# Patient Record
Sex: Female | Born: 1965 | Race: Black or African American | Hispanic: No | Marital: Single | Smoking: Former smoker
Health system: Southern US, Community
[De-identification: ages and names within clinical notes are randomized; demographics above are authoritative.]

## PROBLEM LIST (undated history)

## (undated) ENCOUNTER — Emergency Department (HOSPITAL_BASED_OUTPATIENT_CLINIC_OR_DEPARTMENT_OTHER): Payer: Self-pay

## (undated) DIAGNOSIS — R7303 Prediabetes: Secondary | ICD-10-CM

## (undated) DIAGNOSIS — M1990 Unspecified osteoarthritis, unspecified site: Secondary | ICD-10-CM

## (undated) DIAGNOSIS — E559 Vitamin D deficiency, unspecified: Secondary | ICD-10-CM

## (undated) HISTORY — DX: Prediabetes: R73.03

## (undated) HISTORY — DX: Unspecified osteoarthritis, unspecified site: M19.90

## (undated) HISTORY — DX: Vitamin D deficiency, unspecified: E55.9

## (undated) HISTORY — PX: OTHER SURGICAL HISTORY: SHX169

---

## 1990-11-01 HISTORY — PX: TUBAL LIGATION: SHX77

## 2011-11-15 ENCOUNTER — Emergency Department (INDEPENDENT_AMBULATORY_CARE_PROVIDER_SITE_OTHER)
Admission: EM | Admit: 2011-11-15 | Discharge: 2011-11-15 | Disposition: A | Discharge status: Home / Self Care | Payer: Self-pay | Source: Home / Self Care | Attending: Emergency Medicine | Admitting: Emergency Medicine

## 2011-11-15 ENCOUNTER — Encounter (HOSPITAL_COMMUNITY): Payer: Self-pay | Admitting: Emergency Medicine

## 2011-11-15 DIAGNOSIS — K130 Diseases of lips: Secondary | ICD-10-CM

## 2011-11-15 MED ORDER — LIDOCAINE VISCOUS 2 % MT SOLN: 10.0000 mL | OROMUCOSAL | Status: AC | PRN

## 2011-11-15 NOTE — ED Provider Notes (Signed)
History     CSN: [id number]  Arrival date & time 11/15/11  1140   First MD Initiated Contact with Patient 11/15/11 1354      Chief Complaint  Patient presents with  . Mouth Lesions    (Consider location/radiation/quality/duration/timing/severity/associated sxs/prior treatment) HPI Comments: Pt states she bit her inner lip about 5 months ago and reports tender gradually enlarging mass in this area. States she has a habit of biting it as it gets larger.has been doing salt water and peroxide gargles w/o relief. Lesion more painful when eating hot foods/liquids. No purulent d/c, other oral lesions. Pt is smoker. No ST, tongue pain, neck pain, unintentional weight loss.   Patient is a 46 y.o. female presenting with mouth sores. The history is provided by the patient.  Mouth Lesions  The current episode started more than 2 weeks ago. The onset was gradual. The problem has been gradually worsening. Associated symptoms include mouth sores. Pertinent negatives include no fever, no nausea, no vomiting, no sore throat, no swollen glands and no rash.    History reviewed. No pertinent past medical history.  History reviewed. No pertinent past surgical history.  History reviewed. No pertinent family history.  History  Substance Use Topics  . Smoking status: Current Everyday Smoker  . Smokeless tobacco: Not on file  . Alcohol Use: No    OB History    Grav Para Term Preterm Abortions TAB SAB Ect Mult Living                  Review of Systems  Constitutional: Negative for fever and unexpected weight change.  HENT: Positive for mouth sores. Negative for sore throat and voice change.   Gastrointestinal: Negative for nausea and vomiting.  Skin: Negative for rash.    Allergies  Review of patient's allergies indicates no known allergies.  Home Medications   Current Outpatient Rx  Name Route Sig Dispense Refill  . LIDOCAINE VISCOUS 2 % MT SOLN Oral Take 10 mLs by mouth as needed for  pain. Swish and spit. Do not swallow. 60 mL 0    BP 136/87  Pulse 71  Temp(Src) 97.5 F (36.4 C) (Oral)  Resp 18  SpO2 96%  LMP 11/11/2011  Physical Exam  Nursing note and vitals reviewed. Constitutional: She is oriented to person, place, and time. She appears well-developed and well-nourished. No distress.  HENT:  Head: Normocephalic and atraumatic.  Nose: Nose normal.  Mouth/Throat: Uvula is midline and oropharynx is clear and moist. Oral lesions present.       approx 0.5 cm nodule appears to be hypertrophic area of inner lip mucosa. Small central ulceration. No expressable purulent d/c. Tongue , gums, other oral mucosa WNL, no swelling under tongue.   Eyes: Conjunctivae and EOM are normal.  Neck: Normal range of motion. Neck supple.  Cardiovascular: Regular rhythm.   Pulmonary/Chest: Effort normal.  Abdominal: She exhibits no distension.  Musculoskeletal: Normal range of motion.  Lymphadenopathy:       Head (right side): No submental and no submandibular adenopathy present.       Head (left side): No submental and no submandibular adenopathy present.    She has no cervical adenopathy.  Neurological: She is alert and oriented to person, place, and time.  Skin: Skin is warm and dry.  Psychiatric: She has a normal mood and affect. Her behavior is normal. Judgment and thought content normal.    ED Course  Procedures (including critical care time)  Labs Reviewed -  No data to display No results found.   1. Lip lesion     MDM  Appears to be hypertrophic mucosal tissue from continuous irritation. No signs of infection or oral cancer. Will refer to Dr. Lazarus Salines ENT on call for definitive dx and tx. Will send home with viscous lidocaine if becomes very painful but advised pt that she may injure this area worse while area is numb.    Luiz Blare, MD 11/15/11 [phone]

## 2011-11-15 NOTE — ED Notes (Signed)
PT HEREWITH MOUTH LESION TO RIGHT INNER LIP THAT APPEARED X AGO BUT HAS GOTTEN BIGGER AND HURTS WITH EATING HOT FOODS.PT TRIED GARGLING WITH SALT BUT NO IMPROVEMENT.PT ALSO STATES SHE CAN'T HAVE ANY NARCOTICS S/P DRUG RECOVERY X 

## 2011-11-19 NOTE — ED Notes (Signed)
Medical follow-up request form received from Dr. Chaney Malling for Dr. Lazarus Salines  in 1-2 weeks for polyp on inner mucosa of right lip.  Appt. scheduled with Dr. Jenne Pane on Wed. 1/23 @ 0950.  She said since pt. is self pay, she will have to pay $100.00 up front. I called pt. and gave her appt. date time, location, phone number and up front payment. Pt. requested appt. on Mon. I told her it was to late for me to call.   I told her she would have to call and reschedule if that is not convenient.  Record faxed attn Thayer Ohm to [phone] and confirmation received. Vassie Moselle 11/19/2011

## 2012-06-15 ENCOUNTER — Other Ambulatory Visit (HOSPITAL_COMMUNITY): Payer: Self-pay | Admitting: Family Medicine

## 2012-06-15 ENCOUNTER — Other Ambulatory Visit (HOSPITAL_COMMUNITY)
Admission: RE | Admit: 2012-06-15 | Discharge: 2012-06-15 | Disposition: A | Discharge status: Home / Self Care | Payer: Self-pay | Source: Ambulatory Visit | Attending: Family Medicine | Admitting: Family Medicine

## 2012-06-15 DIAGNOSIS — Z113 Encounter for screening for infections with a predominantly sexual mode of transmission: Secondary | ICD-10-CM | POA: Insufficient documentation

## 2012-06-15 DIAGNOSIS — Z1231 Encounter for screening mammogram for malignant neoplasm of breast: Secondary | ICD-10-CM

## 2012-06-15 DIAGNOSIS — Z124 Encounter for screening for malignant neoplasm of cervix: Secondary | ICD-10-CM | POA: Insufficient documentation

## 2012-06-29 ENCOUNTER — Ambulatory Visit (HOSPITAL_COMMUNITY)
Admission: RE | Admit: 2012-06-29 | Discharge: 2012-06-29 | Disposition: A | Discharge status: Home / Self Care | Payer: Self-pay | Source: Ambulatory Visit | Attending: Family Medicine | Admitting: Family Medicine

## 2012-06-29 DIAGNOSIS — Z1231 Encounter for screening mammogram for malignant neoplasm of breast: Secondary | ICD-10-CM | POA: Insufficient documentation

## 2013-12-17 ENCOUNTER — Encounter (INDEPENDENT_AMBULATORY_CARE_PROVIDER_SITE_OTHER): Payer: Self-pay | Admitting: Surgery

## 2014-01-13 ENCOUNTER — Emergency Department (HOSPITAL_COMMUNITY): Payer: Self-pay

## 2014-01-13 ENCOUNTER — Emergency Department (HOSPITAL_COMMUNITY)
Admission: EM | Admit: 2014-01-13 | Discharge: 2014-01-13 | Disposition: A | Discharge status: Home / Self Care | Payer: Self-pay | Attending: Emergency Medicine | Admitting: Emergency Medicine

## 2014-01-13 ENCOUNTER — Encounter (HOSPITAL_COMMUNITY): Payer: Self-pay | Admitting: Emergency Medicine

## 2014-01-13 DIAGNOSIS — R197 Diarrhea, unspecified: Secondary | ICD-10-CM | POA: Insufficient documentation

## 2014-01-13 DIAGNOSIS — J111 Influenza due to unidentified influenza virus with other respiratory manifestations: Secondary | ICD-10-CM | POA: Insufficient documentation

## 2014-01-13 DIAGNOSIS — R69 Illness, unspecified: Secondary | ICD-10-CM

## 2014-01-13 DIAGNOSIS — F17200 Nicotine dependence, unspecified, uncomplicated: Secondary | ICD-10-CM | POA: Insufficient documentation

## 2014-01-13 MED ORDER — ACETAMINOPHEN 500 MG PO TABS
1000.0000 mg | ORAL_TABLET | Freq: Once | ORAL | Status: AC
  Administered 2014-01-13: 1000 mg via ORAL
  Filled 2014-01-13: qty 2

## 2014-01-13 MED ORDER — ALBUTEROL SULFATE HFA 108 (90 BASE) MCG/ACT IN AERS
2.0000 | INHALATION_SPRAY | RESPIRATORY_TRACT | Status: DC | PRN
  Administered 2014-01-13: 2 via RESPIRATORY_TRACT
  Filled 2014-01-13: qty 6.7

## 2014-01-13 MED ORDER — AEROCHAMBER PLUS W/MASK MISC
1.0000 | Freq: Once | Status: AC
  Administered 2014-01-13: 1
  Filled 2014-01-13: qty 1

## 2014-01-13 NOTE — ED Provider Notes (Signed)
CSN: [id number]     Arrival date & time 01/13/14  1202 History   First MD Initiated Contact with Patient 01/13/14 1331     Chief Complaint  Patient presents with  . Influenza     (Consider location/radiation/quality/duration/timing/severity/associated sxs/prior Treatment) HPI complains of diffuse myalgias headache and productive cough and shortness of breath onset 4 days ago. She admits to subjective fever did not take her temperature. Also admits to 4 episodes of diarrhea in the past 4 days. Nonbloody diarrhea. Has been treating herself with Alka-Seltzer and Halls cough drops, without relief. Associated symptoms include shortness of breath. Nothing makes symptoms better or worse. She admits to chest pain with coughing only. History reviewed. No pertinent past medical history. past medical history recovered cocaine addict. No history of IV drug use. History reviewed. No pertinent past surgical history. History reviewed. No pertinent family history. History  Substance Use Topics  . Smoking status: Current Every Day Smoker  . Smokeless tobacco: Not on file  . Alcohol Use: No   ex smoker quit 10 months ago no alcohol OB History   Grav Para Term Preterm Abortions TAB SAB Ect Mult Living                 Review of Systems  Respiratory: Positive for cough and shortness of breath.   Gastrointestinal: Positive for diarrhea.  Musculoskeletal: Positive for myalgias.  All other systems reviewed and are negative.      Allergies  Review of patient's allergies indicates no known allergies.  Home Medications   Current Outpatient Rx  Name  Route  Sig  Dispense  Refill  . Menthol (HALLS COUGH DROPS MT)   Mouth/Throat   Use as directed 1 lozenge in the mouth or throat every 4 (four) hours as needed (cough).         . Sodium & Potassium Bicarbonate (ALKA-SELTZER GOLD PO)   Oral   Take 2 tablets by mouth daily as needed (sleep).          BP 136/82  Pulse 62  Temp(Src) 98.7 F  (37.1 C) (Oral)  Resp 18  SpO2 99% Physical Exam  Nursing note and vitals reviewed. Constitutional: She appears well-developed and well-nourished.  HENT:  Head: Normocephalic and atraumatic.  Eyes: Conjunctivae are normal. Pupils are equal, round, and reactive to light.  Neck: Neck supple. No tracheal deviation present. No thyromegaly present.  Cardiovascular: Normal rate and regular rhythm.   No murmur heard. Pulmonary/Chest: Effort normal and breath sounds normal.  Abdominal: Soft. Bowel sounds are normal. She exhibits no distension. There is no tenderness.  Musculoskeletal: Normal range of motion. She exhibits no edema and no tenderness.  Neurological: She is alert. Coordination normal.  Skin: Skin is warm and dry. No rash noted.  Psychiatric: She has a normal mood and affect.    ED Course  Procedures (including critical care time) Labs Review Labs Reviewed - No data to display Imaging Review No results found.   EKG Interpretation None     Patient feels much improved after treatment with albuterol inhaler  And Tylenol. Feels ready to go home.  Chest x-ray viewed by me. No results found for this or any previous visit. Dg Chest 2 View  01/13/2014   CLINICAL DATA:  Chest pain and productive cough  EXAM: CHEST  2 VIEW  COMPARISON:  None.  FINDINGS: Lungs are  planPlaclear. The heart size and pulmonary vascularity are normal. No adenopathy. No pneumothorax. No bone lesions. There is an  old healed fracture of the left clavicle.  IMPRESSION: No edema or consolidation.   Electronically Signed   By: Lowella Grip M.D.   On: 01/13/2014 15:27    MDM   Final diagnoses:  None   Plan albuterol inhaler with spacer to go to use 2 puffs every 4 hours when necessary cough or shortness of breath. Tylenol for pain. Referral resource guide were: [facility]ness Center to get a primary care physician Diagnosis influenza-like illness     Orlie Dakin, MD 01/13/14 1539

## 2014-01-13 NOTE — Discharge Instructions (Signed)
Use your inhaler with spacer 2 puffs every 4 hours as needed for cough shortness of breath. Return if needed more than every 4 hours. Take Tylenol as directed for pain every 4 hours as needed. Call the wellness Center or any of the numbers on the resource guide to get a primary care physician.  Emergency Department Resource Guide 1) Find a Doctor and Pay Out of Pocket Although you won't have to find out who is covered by your insurance plan, it is a good idea to ask around and get recommendations. You will then need to call the office and see if the doctor you have chosen will accept you as a new patient and what types of options they offer for patients who are self-pay. Some doctors offer discounts or will set up payment plans for their patients who do not have insurance, but you will need to ask so you aren't surprised when you get to your appointment.  2) Contact Your Local Health Department Not all health departments have doctors that can see patients for sick visits, but many do, so it is worth a call to see if yours does. If you don't know where your local health department is, you can check in your phone book. The CDC also has a tool to help you locate your state's health department, and many state websites also have listings of all of their local health departments.  3) Find a [facility] If your illness is not likely to be very severe or complicated, you may want to try a walk in clinic. These are popping up all over the country in pharmacies, drugstores, and shopping centers. They're usually staffed by nurse practitioners or physician assistants that have been trained to treat common illnesses and complaints. They're usually fairly quick and inexpensive. However, if you have serious medical issues or chronic medical problems, these are probably not your best option.  No Primary Care Doctor: - Call Health Connect at  [phone] - they can help you locate a primary care doctor that  accepts  your insurance, provides certain services, etc. - Physician Referral Service- [phone]  Chronic Pain Problems: Organization         Address  Phone   Notes  [facility]  [phone] Patients need to be referred by their primary care doctor.   Medication Assistance: Organization         Address  Phone   Notes  Comprehensive Outpatient [place] Medication [facility] [address] [place], [place] [address] [phone] --Must be a resident of [facility] -- Must have NO insurance coverage whatsoever (no Medicaid/ Medicare, etc.) -- The pt. MUST have a primary care doctor that directs their care regularly and follows them in the community   MedAssist  [phone]   [employer]  [phone]    Agencies that provide inexpensive medical care: Organization         Address  Phone   Notes  [place]  [phone]   [facility]    [phone]   [facility] [place], [place] [address] [phone]   [place] [address], [place] [phone]   Planned Parenthood    [phone]   [facility]    [phone]   [place] and [place], [place] Phone:  [phone], Fax:  [phone] Hours  of Operation:  9 am - 6 pm, M-F.  Also accepts Medicaid/Medicare and self-pay.  [facility] for [place], [address], [place] Phone: [phone], Fax: [phone]. Hours of Operation:  8:30 am - 5:30 pm, M-F.  Also accepts Medicaid and self-pay.  [facility] [place] [address], [place] Phone: [phone]   [place], [place], [place] [phone], Ext. [phone] Mondays & Thursdays: 7-9 AM.  First 15 patients are seen on a first come, first serve basis.    [place] Providers:  Organization          Address  Phone   Notes  [facility] [address],  [phone] Also accepts self-pay patients.  [facility] [address], [place]  [phone]   [place], [address], [place] [phone]   [facility] Family Medicine [address] [place] [phone]   Lucianne Lei [address], [place]   [phone] Only accepts [place] Access [place] patients after they have their name applied to their card.   Self-Pay (no insurance) in [facility]:  Organization         Address  Phone   Notes  Sickle Cell Patients, [facility] Internal Medicine [place] [phone]   [facility] [place] [phone]   [facility] [place]  [place], [place], [place] [phone]   [facility]/Dr. Osei-Bonsu  [address], [place] or [address], [place] [phone] Phone number for both [place] and [place] locations is the same.  [facility]gent [facility] and [facility] [address] [place] [phone]   [facility] [address], [place] or [address] [phone]   [facility] [address] [place] [phone], phone; [phone], fax Sees patients 1st and 3rd Saturday of every month.  Must not qualify for public or private insurance (i.e. Medicaid, Medicare, [place] Health Choice, Veterans' Benefits)  Household income should be no more than 200% of the poverty level The clinic cannot treat you if you are pregnant or think you are pregnant  Sexually transmitted diseases are not treated at the clinic.    Dental Care: Organization         Address  Phone  Notes  [facility] Department of [facility] [phone] Accepts children up to age 64 who are enrolled in [place] or [place]; pregnant women with a Medicaid card; and children who have applied for Medicaid or [place] Health Choice, but were declined, whose parents can pay a reduced fee at time of service.  [facility] Department of [facility]  [address], [place] [phone] Accepts children up to age 2 who are enrolled in [place] or [place]; pregnant women with a Medicaid card; and children who have applied for Medicaid or [place] Health Choice, but were declined, whose parents can pay a reduced fee at time of service.  [place] Adult Dental Access PROGRAM  [place] [phone] Patients are seen by appointment only. Walk-ins are not accepted. [place] will see  patients 61 years of age and older. Monday - Tuesday (8am-5pm) Most Wednesdays (8:30-5pm) $30 per visit, cash only  [facility] Adult Dental Access PROGRAM  [address], [facility] [phone] Patients are seen by appointment only. Walk-ins are not accepted. [place] will see patients 29 years of age and older. One Wednesday Evening (Monthly: Volunteer Based).  $30 per visit, cash only  [place]  [phone] for adults; Children under age 71, call Graduate Pediatric Dentistry at [phone]. Children aged 36-14, please call [phone] to request a pediatric application.  Dental services are provided in all areas of dental care including fillings, crowns and bridges, complete and partial dentures, implants, gum treatment, root canals, and extractions. Preventive care is also provided. Treatment is provided to both adults and children. Patients are selected via a lottery and there is often a waiting list.   [facility] [address], [place]  [phone] [email or web address]   [facility] [address] [place], [place]  [phone], Ext. [phone] Second and Fourth Thursday of each month, opens at 6:30 AM; Clinic ends at 9 AM.  Patients are seen on a first-come first-served basis, and a limited number are seen during each clinic.   [facility]  [address] Hillard Danker [place], [place] [phone]   Eligibility Requirements You must have lived in [place], [place], or [place] counties for at least the last three months.   You cannot be eligible for state or federal sponsored [employer], including [employer], [place], or [employer].   You generally cannot be eligible for healthcare insurance through your employer.    How to apply: Eligibility screenings are held every Tuesday and Wednesday afternoon from 1:00 pm until 4:00 pm. You do not need an appointment for the interview!  [facility] [address] [place], [place]   [place]  [place] Department  [place]  [phone]    Behavioral Health Resources in the Community: Intensive Outpatient Programs Organization         Address  Phone  Notes  Leona Louin. [address] [place], [place] [phone]   [facility] Outpatient [address], [place], [place]   ADS: Alcohol & Drug Svcs [address] [place], [place]   [place] [address]  [place], [place] or [phone]   Substance Abuse Resources Organization         Address  Phone  Notes  Alcohol and Drug Services  [phone]   [place]  [phone]   [facility] [place]   Chinita Pester  [phone]   Residential & Outpatient Substance Abuse Program  [phone]   Psychological Services Organization         Address  Phone  Notes  [facility]  [place]  [phone]    [facility] [address] [place] or [phone]    Mobile Crisis Teams Organization         Address  Phone  Notes  [facility], Mobile Crisis Care Unit  [phone]   Assertive Psychotherapeutic Services  [address] [place], [place]   [facility] [address] [place] [phone]    Self-Help/Support Groups Organization  Address  Phone             Notes  [place]. of [place] - variety of support groups  [place] Call for more information  Narcotics Anonymous (NA), [facility] [address], [employer] [place]  2 meetings at this location   Special educational needs teacher         Address  Phone  Notes  ASAP Residential Treatment [place],    [place]  [phone]   [facility]  [address], [place] [phone], [place], [place]   [place], [place] [phone] Admissions: 8am-3pm M-F  Incentives Substance [place] [address]    [place], [place] [phone]   [facility] [address] [place], [place], [place]   [facility] [address],  [place], [place]   [facility] Programs - Intensive Outpatient [address] Kristeen [address], [place], [place]   [facility] [place]  [place], [place] [phone] or [phone]   [facility] ([facility]) [address], [place], [place] Accepts Medicaid  [facility] [place] [address],  [place] [phone] Substance Abuse/Addiction Treatment   [facility] Organization         Address  Phone  Notes  [facility]  [phone]   Domenic Schwab, PhD [address] Arlis [place], [place]   [phone] or [phone]   [place]  [place], [place] [phone]   [facility] [address], [place], [place] [phone] Insurance/Medicaid/sponsorship through [facility] and Families [address], [address] [place]                                    [place], [place] [phone] [place] [address], [place] [phone]    Dr. Adele Schilder  [phone]   [facility] Dept. 1) [address] [place] 2) [place] 3)  [place] [address], [place] [phone]  [phone]   [place] [phone] or [phone] (After Hours)

## 2014-01-13 NOTE — ED Notes (Signed)
Patient transported to X-ray 

## 2014-01-13 NOTE — ED Notes (Signed)
Reports flu like symptoms for several days including diarrhea, fever/chills, bodyaches, productive cough and headache. Mask on pt at triage.

## 2014-01-13 NOTE — ED Notes (Signed)
Patient returned from X-ray 

## 2014-05-11 ENCOUNTER — Emergency Department (HOSPITAL_COMMUNITY): Payer: Self-pay

## 2014-05-11 ENCOUNTER — Emergency Department (HOSPITAL_COMMUNITY)
Admission: EM | Admit: 2014-05-11 | Discharge: 2014-05-11 | Disposition: A | Discharge status: Home / Self Care | Payer: Self-pay | Attending: Emergency Medicine | Admitting: Emergency Medicine

## 2014-05-11 ENCOUNTER — Encounter (HOSPITAL_COMMUNITY): Payer: Self-pay | Admitting: Emergency Medicine

## 2014-05-11 DIAGNOSIS — M549 Dorsalgia, unspecified: Secondary | ICD-10-CM | POA: Insufficient documentation

## 2014-05-11 DIAGNOSIS — M62838 Other muscle spasm: Secondary | ICD-10-CM | POA: Insufficient documentation

## 2014-05-11 DIAGNOSIS — F17200 Nicotine dependence, unspecified, uncomplicated: Secondary | ICD-10-CM | POA: Insufficient documentation

## 2014-05-11 DIAGNOSIS — R071 Chest pain on breathing: Secondary | ICD-10-CM | POA: Insufficient documentation

## 2014-05-11 LAB — CBC
HCT: 35.7 % — ABNORMAL LOW (ref 36.0–46.0)
Hemoglobin: 11.9 g/dL — ABNORMAL LOW (ref 12.0–15.0)
MCH: 30.1 pg (ref 26.0–34.0)
MCHC: 33.3 g/dL (ref 30.0–36.0)
MCV: 90.2 fL (ref 78.0–100.0)
PLATELETS: 310 10*3/uL (ref 150–400)
RBC: 3.96 MIL/uL (ref 3.87–5.11)
RDW: 13.4 % (ref 11.5–15.5)
WBC: 8.2 10*3/uL (ref 4.0–10.5)

## 2014-05-11 LAB — I-STAT TROPONIN, ED: Troponin i, poc: 0 ng/mL (ref 0.00–0.08)

## 2014-05-11 LAB — BASIC METABOLIC PANEL
ANION GAP: 15 (ref 5–15)
BUN: 13 mg/dL (ref 6–23)
CALCIUM: 9.1 mg/dL (ref 8.4–10.5)
CO2: 23 mEq/L (ref 19–32)
CREATININE: 0.6 mg/dL (ref 0.50–1.10)
Chloride: 103 mEq/L (ref 96–112)
GFR calc non Af Amer: >90 mL/min (ref >90)
Glucose, Bld: 144 mg/dL — ABNORMAL HIGH (ref 70–99)
Potassium: 3.6 mEq/L — ABNORMAL LOW (ref 3.7–5.3)
SODIUM: 141 meq/L (ref 137–147)

## 2014-05-11 MED ORDER — CYCLOBENZAPRINE HCL 10 MG PO TABS
5.0000 mg | ORAL_TABLET | Freq: Once | ORAL | Status: AC
  Administered 2014-05-11: 5 mg via ORAL
  Filled 2014-05-11: qty 1

## 2014-05-11 MED ORDER — CYCLOBENZAPRINE HCL 5 MG PO TABS: 5.0000 mg | ORAL_TABLET | Freq: Three times a day (TID) | ORAL | Status: DC | PRN

## 2014-05-11 MED ORDER — IBUPROFEN 800 MG PO TABS: 800.0000 mg | ORAL_TABLET | Freq: Three times a day (TID) | ORAL | Status: DC | PRN

## 2014-05-11 MED ORDER — IBUPROFEN 800 MG PO TABS
800.0000 mg | ORAL_TABLET | Freq: Once | ORAL | Status: AC
  Administered 2014-05-11: 800 mg via ORAL
  Filled 2014-05-11: qty 1

## 2014-05-11 NOTE — Discharge Instructions (Signed)

## 2014-05-11 NOTE — ED Provider Notes (Signed)
CSN: [id number]     Arrival date & time 05/11/14  1122 History   First MD Initiated Contact with Patient 05/11/14 1132     Chief Complaint  Patient presents with  . Chest Pain  . Extremity Weakness  . Back Pain     (Consider location/radiation/quality/duration/timing/severity/associated sxs/prior Treatment) HPI Comments: Also having similar soreness/aching in upper back  Patient is a 48 y.o. female presenting with chest pain, extremity weakness, and back pain. The history is provided by the patient.  Chest Pain Pain location:  R chest Pain quality: aching   Pain radiates to:  Does not radiate Pain radiates to the back: no   Pain severity:  Moderate Onset quality:  Gradual Timing:  Intermittent Progression:  Unchanged Chronicity:  New Context: at rest   Relieved by:  Nothing Worsened by:  Nothing tried Ineffective treatments:  None tried Associated symptoms: back pain and numbness (tingling in R arm)   Associated symptoms: no abdominal pain, no cough, no shortness of breath and not vomiting   Extremity Weakness Associated symptoms include chest pain. Pertinent negatives include no abdominal pain and no shortness of breath.  Back Pain Associated symptoms: chest pain and numbness (tingling in R arm)   Associated symptoms: no abdominal pain     History reviewed. No pertinent past medical history. History reviewed. No pertinent past surgical history. History reviewed. No pertinent family history. History  Substance Use Topics  . Smoking status: Current Every Day Smoker  . Smokeless tobacco: Not on file  . Alcohol Use: No   OB History   Grav Para Term Preterm Abortions TAB SAB Ect Mult Living                 Review of Systems  Respiratory: Negative for cough and shortness of breath.   Cardiovascular: Positive for chest pain.  Gastrointestinal: Negative for vomiting and abdominal pain.  Musculoskeletal: Positive for back pain and extremity weakness.  Neurological:  Positive for numbness (tingling in R arm).  All other systems reviewed and are negative.     Allergies  Review of patient's allergies indicates no known allergies.  Home Medications   Prior to Admission medications   Medication Sig Start Date End Date Taking? Authorizing Provider  ibuprofen (ADVIL,MOTRIN) 200 MG tablet Take 200 mg by mouth every 6 (six) hours as needed for mild pain.   Yes Historical Provider, MD   BP 107/71  Pulse 72  Temp(Src) 98.3 F (36.8 C) (Oral)  Resp 18  Ht 5\' 2"  (1.575 m)  Wt 235 lb (106.595 kg)  BMI 42.97 kg/m2  SpO2 99%  LMP 05/10/2014 Physical Exam  Nursing note and vitals reviewed. Constitutional: She is oriented to person, place, and time. She appears well-developed and well-nourished. No distress.  HENT:  Head: Normocephalic and atraumatic.  Eyes: EOM are normal. Pupils are equal, round, and reactive to light.  Neck: Normal range of motion. Neck supple.  Cardiovascular: Normal rate and regular rhythm.  Exam reveals no friction rub.   No murmur heard. Pulmonary/Chest: Effort normal and breath sounds normal. No respiratory distress. She has no wheezes. She has no rales. She exhibits tenderness (R chest wall).  Abdominal: Soft. She exhibits no distension. There is no tenderness. There is no rebound.  Musculoskeletal: Normal range of motion. She exhibits no edema.       Cervical back: She exhibits tenderness (R trapezius).  Neurological: She is alert and oriented to person, place, and time. No cranial nerve deficit. She exhibits  normal muscle tone. Coordination normal.  Skin: She is not diaphoretic.    ED Course  Procedures (including critical care time) Labs Review Labs Reviewed  [place], ED    Imaging Review No results found.   EKG Interpretation   Date/Time:  Saturday May 11 2014 11:27:47 EDT Ventricular Rate:  71 PR Interval:  184 QRS Duration: 88 QT Interval:  392 QTC Calculation:  425 R Axis:   13 Text Interpretation:  Normal sinus rhythm Normal ECG No prior Confirmed by  Mingo Amber  MD, Reni Hausner ([id number]) on 05/11/2014 11:33:12 AM      MDM   Final diagnoses:  Muscle spasm    70F presents with chest pain. Began this morning. Described as achiness in her right chest wall and right back. No radiation through to the back. Mild associated tingling described as pins and needles in her right arm. 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 Denies any pleuritic pain, cough, other left-sided chest pain, fever. On exam she has right trapezius and right chest wall tenderness. Right arm is neurovascularly intact, but has mild altered light touch. I think she has muscle spasm causing her tingling. Given Motrin and Flexeril. EKG normal. Troponin normal - done in triage. Exam and history not consistent with ACS. Feeling better after meds. Stable for discharge.     Osvaldo Shipper, MD 05/11/14 1339

## 2014-05-11 NOTE — ED Notes (Signed)
Pt reports back and chest paina nd right arm numbness, onset this am, sts she does lift things at work, sts she does not want narcotics.

## 2014-07-13 ENCOUNTER — Emergency Department (HOSPITAL_COMMUNITY)
Admission: EM | Admit: 2014-07-13 | Discharge: 2014-07-13 | Discharge status: Against Medical Advice | Payer: Self-pay | Attending: Emergency Medicine | Admitting: Emergency Medicine

## 2014-07-13 DIAGNOSIS — F17200 Nicotine dependence, unspecified, uncomplicated: Secondary | ICD-10-CM | POA: Insufficient documentation

## 2014-07-13 DIAGNOSIS — M79609 Pain in unspecified limb: Secondary | ICD-10-CM | POA: Insufficient documentation

## 2014-07-13 NOTE — ED Notes (Signed)
Pt is unable to be located

## 2014-07-13 NOTE — ED Notes (Signed)
Called pt with no answer.

## 2014-07-13 NOTE — ED Notes (Signed)
The pt walked out of triage room and told ed staff "she was not waiting." unable to locate pt with pager and name call

## 2014-11-01 DIAGNOSIS — E559 Vitamin D deficiency, unspecified: Secondary | ICD-10-CM

## 2014-11-01 HISTORY — DX: Vitamin D deficiency, unspecified: E55.9

## 2014-12-18 ENCOUNTER — Encounter: Payer: Self-pay | Admitting: Internal Medicine

## 2014-12-18 ENCOUNTER — Ambulatory Visit: Payer: Self-pay | Attending: Internal Medicine | Admitting: Internal Medicine

## 2014-12-18 VITALS — BP 116/80 | HR 74 | Temp 98.8°F | Resp 16 | Wt 243.8 lb

## 2014-12-18 DIAGNOSIS — Z139 Encounter for screening, unspecified: Secondary | ICD-10-CM

## 2014-12-18 DIAGNOSIS — R7303 Prediabetes: Secondary | ICD-10-CM | POA: Insufficient documentation

## 2014-12-18 DIAGNOSIS — H6123 Impacted cerumen, bilateral: Secondary | ICD-10-CM

## 2014-12-18 DIAGNOSIS — E119 Type 2 diabetes mellitus without complications: Secondary | ICD-10-CM | POA: Insufficient documentation

## 2014-12-18 DIAGNOSIS — N951 Menopausal and female climacteric states: Secondary | ICD-10-CM

## 2014-12-18 DIAGNOSIS — R232 Flushing: Secondary | ICD-10-CM

## 2014-12-18 DIAGNOSIS — Z833 Family history of diabetes mellitus: Secondary | ICD-10-CM | POA: Insufficient documentation

## 2014-12-18 DIAGNOSIS — R7309 Other abnormal glucose: Secondary | ICD-10-CM | POA: Insufficient documentation

## 2014-12-18 DIAGNOSIS — Z1231 Encounter for screening mammogram for malignant neoplasm of breast: Secondary | ICD-10-CM | POA: Insufficient documentation

## 2014-12-18 DIAGNOSIS — Z0000 Encounter for general adult medical examination without abnormal findings: Secondary | ICD-10-CM

## 2014-12-18 DIAGNOSIS — H6121 Impacted cerumen, right ear: Secondary | ICD-10-CM | POA: Insufficient documentation

## 2014-12-18 DIAGNOSIS — Z3202 Encounter for pregnancy test, result negative: Secondary | ICD-10-CM | POA: Insufficient documentation

## 2014-12-18 DIAGNOSIS — E28310 Symptomatic premature menopause: Secondary | ICD-10-CM | POA: Insufficient documentation

## 2014-12-18 DIAGNOSIS — H6120 Impacted cerumen, unspecified ear: Secondary | ICD-10-CM | POA: Insufficient documentation

## 2014-12-18 DIAGNOSIS — Z87891 Personal history of nicotine dependence: Secondary | ICD-10-CM | POA: Insufficient documentation

## 2014-12-18 DIAGNOSIS — N926 Irregular menstruation, unspecified: Secondary | ICD-10-CM

## 2014-12-18 HISTORY — DX: Prediabetes: R73.03

## 2014-12-18 HISTORY — DX: Type 2 diabetes mellitus without complications: E11.9

## 2014-12-18 LAB — CBC WITH DIFFERENTIAL/PLATELET
BASOS ABS: 0 10*3/uL (ref 0.0–0.1)
Basophils Relative: 0 % (ref 0–1)
EOS PCT: 1 % (ref 0–5)
Eosinophils Absolute: 0.1 10*3/uL (ref 0.0–0.7)
HCT: 38.2 % (ref 36.0–46.0)
Hemoglobin: 12.4 g/dL (ref 12.0–15.0)
LYMPHS PCT: 34 % (ref 12–46)
Lymphs Abs: 2.9 10*3/uL (ref 0.7–4.0)
MCH: 30 pg (ref 26.0–34.0)
MCHC: 32.5 g/dL (ref 30.0–36.0)
MCV: 92.3 fL (ref 78.0–100.0)
MPV: 9.4 fL (ref 8.6–12.4)
Monocytes Absolute: 0.7 10*3/uL (ref 0.1–1.0)
Monocytes Relative: 8 % (ref 3–12)
NEUTROS ABS: 4.9 10*3/uL (ref 1.7–7.7)
NEUTROS PCT: 57 % (ref 43–77)
PLATELETS: 324 10*3/uL (ref 150–400)
RBC: 4.14 MIL/uL (ref 3.87–5.11)
RDW: 14 % (ref 11.5–15.5)
WBC: 8.6 10*3/uL (ref 4.0–10.5)

## 2014-12-18 LAB — COMPLETE METABOLIC PANEL WITH GFR
ALT: 11 U/L (ref 0–35)
AST: 13 U/L (ref 0–37)
Albumin: 3.8 g/dL (ref 3.5–5.2)
Alkaline Phosphatase: 73 U/L (ref 39–117)
BILIRUBIN TOTAL: 0.4 mg/dL (ref 0.2–1.2)
BUN: 16 mg/dL (ref 6–23)
CALCIUM: 9.3 mg/dL (ref 8.4–10.5)
CHLORIDE: 105 meq/L (ref 96–112)
CO2: 27 meq/L (ref 19–32)
CREATININE: 0.7 mg/dL (ref 0.50–1.10)
GLUCOSE: 86 mg/dL (ref 70–99)
Potassium: 4.5 mEq/L (ref 3.5–5.3)
Sodium: 140 mEq/L (ref 135–145)
Total Protein: 7.3 g/dL (ref 6.0–8.3)

## 2014-12-18 LAB — POCT GLYCOSYLATED HEMOGLOBIN (HGB A1C): HEMOGLOBIN A1C: 6.1

## 2014-12-18 LAB — TSH: TSH: 2.268 u[IU]/mL (ref 0.350–4.500)

## 2014-12-18 LAB — POCT URINE PREGNANCY: Preg Test, Ur: NEGATIVE

## 2014-12-18 MED ORDER — CARBAMIDE PEROXIDE 6.5 % OT SOLN: 5.0000 [drp] | Freq: Two times a day (BID) | OTIC | Status: DC

## 2014-12-18 NOTE — Progress Notes (Signed)
Patient here to establish care Patient complains of having a "buzzing" on and off in her right ear Not sure if there might be a bug that crawled into her ear Complains of night sweats and hot flashes Missed her period in Dec and Jan Patient had a tubal ligation- Patient also complains of increased thirst

## 2014-12-18 NOTE — Patient Instructions (Signed)
Diabetes Mellitus and Food It is important for you to manage your blood sugar (glucose) level. Your blood glucose level can be greatly affected by what you eat. Eating healthier foods in the appropriate amounts throughout the day at about the same time each day will help you control your blood glucose level. It can also help slow or prevent worsening of your diabetes mellitus. Healthy eating may even help you improve the level of your blood pressure and reach or maintain a healthy weight.  HOW CAN FOOD AFFECT ME? Carbohydrates Carbohydrates affect your blood glucose level more than any other type of food. Your dietitian will help you determine how many carbohydrates to eat at each meal and teach you how to count carbohydrates. Counting carbohydrates is important to keep your blood glucose at a healthy level, especially if you are using insulin or taking certain medicines for diabetes mellitus. Alcohol Alcohol can cause sudden decreases in blood glucose (hypoglycemia), especially if you use insulin or take certain medicines for diabetes mellitus. Hypoglycemia can be a life-threatening condition. Symptoms of hypoglycemia (sleepiness, dizziness, and disorientation) are similar to symptoms of having too much alcohol.  If your health care provider has given you approval to drink alcohol, do so in moderation and use the following guidelines:  Women should not have more than one drink per day, and men should not have more than two drinks per day. One drink is equal to:  12 oz of beer.  5 oz of wine.  1 oz of hard liquor.  Do not drink on an empty stomach.  Keep yourself hydrated. Have water, diet soda, or unsweetened iced tea.  Regular soda, juice, and other mixers might contain a lot of carbohydrates and should be counted. WHAT FOODS ARE NOT RECOMMENDED? As you make food choices, it is important to remember that all foods are not the same. Some foods have fewer nutrients per serving than other  foods, even though they might have the same number of calories or carbohydrates. It is difficult to get your body what it needs when you eat foods with fewer nutrients. Examples of foods that you should avoid that are high in calories and carbohydrates but low in nutrients include:  Trans fats (most processed foods list trans fats on the Nutrition Facts label).  Regular soda.  Juice.  Candy.  Sweets, such as cake, pie, doughnuts, and cookies.  Fried foods. WHAT FOODS CAN I EAT? Have nutrient-rich foods, which will nourish your body and keep you healthy. The food you should eat also will depend on several factors, including:  The calories you need.  The medicines you take.  Your weight.  Your blood glucose level.  Your blood pressure level.  Your cholesterol level. You also should eat a variety of foods, including:  Protein, such as meat, poultry, fish, tofu, nuts, and seeds (lean animal proteins are best).  Fruits.  Vegetables.  Dairy products, such as milk, cheese, and yogurt (low fat is best).  Breads, grains, pasta, cereal, rice, and beans.  Fats such as olive oil, trans fat-free margarine, canola oil, avocado, and olives. DOES EVERYONE WITH DIABETES MELLITUS HAVE THE SAME MEAL PLAN? Because every person with diabetes mellitus is different, there is not one meal plan that works for everyone. It is very important that you meet with a dietitian who will help you create a meal plan that is just right for you. Document Released: 07/15/2005 Document Revised: 10/23/2013 Document Reviewed: 09/14/2013 Exit[employer] Patient Information 2015 [employer]. This   information is not intended to replace advice given to you by your health care provider. Make sure you discuss any questions you have with your health care provider.  

## 2014-12-18 NOTE — Progress Notes (Signed)
Patient Demographics  Sarah Singh, is a 49 y.o. female  WCB:[id number]  OHY:[id number]  DOB - 1966-03-29  CC:  Chief Complaint  Patient presents with  . Establish Care       HPI: Sarah Singh is a 49 y.o. female here today to establish medical care.patient reported to have missed her  Periods for the last 2-3 months, she does report history of tubal ligation, today her urine pregnancy test is negative, she does report symptoms of hot flushes, also complaining of increased thirst, she does report family history of diabetes, her hemoglobin A1c checked today is noted to be 6.1%, she has prediabetes. Patient is also complaining of right ear fullness/ringing. Patient has No headache, No chest pain, No abdominal pain - No Nausea, No new weakness tingling or numbness, No Cough - SOB.  No Known Allergies History reviewed. No pertinent past medical history. Current Outpatient Prescriptions on File Prior to Visit  Medication Sig Dispense Refill  . cyclobenzaprine (FLEXERIL) 5 MG tablet Take 1 tablet (5 mg total) by mouth 3 (three) times daily as needed for muscle spasms. 20 tablet 0  . ibuprofen (ADVIL,MOTRIN) 200 MG tablet Take 200 mg by mouth every 6 (six) hours as needed for mild pain.    Marland Kitchen ibuprofen (ADVIL,MOTRIN) 800 MG tablet Take 1 tablet (800 mg total) by mouth every 8 (eight) hours as needed. 21 tablet 0   No current facility-administered medications on file prior to visit.   Family History  Problem Relation Age of Onset  . Hypertension Mother   . Diabetes Sister   . Cancer Brother     throat cancer    History   Social History  . Marital Status: Single    Spouse Name: N/A  . Number of Children: N/A  . Years of Education: N/A   Occupational History  . Not on file.   Social History Main Topics  . Smoking status: Former Smoker -- 25 years  . Smokeless tobacco: Not on file  . Alcohol Use: No  . Drug Use: No     Comment: no use in 2 years.   . Sexual Activity: Not  on file   Other Topics Concern  . Not on file   Social History Narrative    Review of Systems: Constitutional: Negative for fever, chills, diaphoresis, activity change, appetite change and fatigue. HENT: Negative for ear pain, nosebleeds, congestion, facial swelling, rhinorrhea, neck pain, neck stiffness and ear discharge.  Eyes: Negative for pain, discharge, redness, itching and visual disturbance. Respiratory: Negative for cough, choking, chest tightness, shortness of breath, wheezing and stridor.  Cardiovascular: Negative for chest pain, palpitations and leg swelling. Gastrointestinal: Negative for abdominal distention. Genitourinary: Negative for dysuria, urgency, frequency, hematuria, flank pain, decreased urine volume, difficulty urinating and dyspareunia.  Musculoskeletal: Negative for back pain, joint swelling, arthralgia and gait problem. Neurological: Negative for dizziness, tremors, seizures, syncope, facial asymmetry, speech difficulty, weakness, light-headedness, numbness and headaches.  Hematological: Negative for adenopathy. Does not bruise/bleed easily. Psychiatric/Behavioral: Negative for hallucinations, behavioral problems, confusion, dysphoric mood, decreased concentration and agitation.    Objective:   Filed Vitals:   12/18/14 1415  BP: 116/80  Pulse: 74  Temp: 98.8 F (37.1 C)  Resp: 16    Physical Exam: Constitutional: Patient appears well-developed and well-nourished. No distress. HENT: Normocephalic, atraumatic, External right and left ear normal. Oropharynx is clear and moist.  Eyes: Conjunctivae and EOM are normal. PERRLA, no scleral icterus. Neck: Normal ROM. Neck supple. No JVD.  No tracheal deviation. No thyromegaly. CVS: RRR, S1/S2 +, no murmurs, no gallops, no carotid bruit.  Pulmonary: Effort and breath sounds normal, no stridor, rhonchi, wheezes, rales.  Abdominal: Soft. BS +, no distension, tenderness, rebound or guarding.  Musculoskeletal:  Normal range of motion. No edema and no tenderness.  Neuro: Alert. Normal reflexes, muscle tone coordination. No cranial nerve deficit. Skin: Skin is warm and dry. No rash noted. Not diaphoretic. No erythema. No pallor. Psychiatric: Normal mood and affect. Behavior, judgment, thought content normal.  Lab Results  Component Value Date   WBC 8.2 05/11/2014   HGB 11.9* 05/11/2014   HCT 35.7* 05/11/2014   MCV 90.2 05/11/2014   PLT 310 05/11/2014   Lab Results  Component Value Date   CREATININE 0.60 05/11/2014   BUN 13 05/11/2014   NA 141 05/11/2014   K 3.6* 05/11/2014   CL 103 05/11/2014   CO2 23 05/11/2014    Lab Results  Component Value Date   HGBA1C 6.1 12/18/2014   Lipid Panel  No results found for: CHOL, TRIG, HDL, CHOLHDL, VLDL, LDLCALC     Assessment and plan:     1. Preventative health care/2. Prediabetes Results for orders placed or performed in visit on 12/18/14  HgB A1c  Result Value Ref Range   Hemoglobin A1C 6.1   POCT urine pregnancy  Result Value Ref Range   Preg Test, Ur Negative    Patient has prediabetes, I have advised patient for low carbohydrate diet, repeat A1c on the following visit.  3. Missed period  - POCT urine pregnancy test is negative  4. Hot flashes/patient is perimenopausal We'll also check TSH level. - TSH  5. Wax in ear  - carbamide peroxide (DEBROX) 6.5 % otic solution; Place 5 drops into the right ear 2 (two) times daily.  Dispense: 15 mL; Refill: 1  6. Encounter for screening mammogram for breast cancer  - MM DIGITAL SCREENING BILATERAL; Future  7. Screening Ordered baseline blood work.  - CBC with Differential/Platelet - COMPLETE METABOLIC PANEL WITH GFR - TSH - Vit D  25 hydroxy (rtn osteoporosis monitoring)        Health Maintenance  -Pap Smear: will be scheduled  -Mammogram: ordered  -Vaccinations:  Patient declines flu shot   Return in about 3 months (around 03/18/2015), or if symptoms worsen or fail  to improve, for IFG, Schedule Appt with Dr Burman Freestone for PAP.   This note has been created with Surveyor, quantity. Any transcriptional errors are unintentional.   Lorayne Marek, MD

## 2014-12-19 LAB — VITAMIN D 25 HYDROXY (VIT D DEFICIENCY, FRACTURES): Vit D, 25-Hydroxy: 8 ng/mL — ABNORMAL LOW (ref 30–100)

## 2014-12-20 ENCOUNTER — Telehealth: Payer: Self-pay

## 2014-12-20 MED ORDER — VITAMIN D (ERGOCALCIFEROL) 1.25 MG (50000 UNIT) PO CAPS: 50000.0000 [IU] | ORAL_CAPSULE | ORAL | Status: DC

## 2014-12-20 NOTE — Telephone Encounter (Signed)
-----   Message from Lorayne Marek, MD sent at 12/19/2014 12:51 PM EST ----- Blood work reviewed, noticed low vitamin D, call patient advise to start ergocalciferol 50,000 units once a week for the duration of  12 weeks.

## 2014-12-20 NOTE — Telephone Encounter (Signed)
Patient is aware of her lab results [facility]  

## 2015-01-03 ENCOUNTER — Ambulatory Visit (HOSPITAL_COMMUNITY): Payer: Self-pay

## 2015-01-16 ENCOUNTER — Telehealth: Payer: Self-pay

## 2015-01-16 NOTE — Telephone Encounter (Signed)
Returned patient phone call Patient not available Left message on voice mail to return our call 

## 2015-04-28 ENCOUNTER — Encounter (HOSPITAL_COMMUNITY): Payer: Self-pay | Admitting: *Deleted

## 2015-04-28 ENCOUNTER — Emergency Department (HOSPITAL_COMMUNITY): Payer: Self-pay

## 2015-04-28 ENCOUNTER — Emergency Department (HOSPITAL_COMMUNITY)
Admission: EM | Admit: 2015-04-28 | Discharge: 2015-04-28 | Disposition: A | Discharge status: Home / Self Care | Payer: Self-pay | Attending: Emergency Medicine | Admitting: Emergency Medicine

## 2015-04-28 DIAGNOSIS — Y9389 Activity, other specified: Secondary | ICD-10-CM | POA: Insufficient documentation

## 2015-04-28 DIAGNOSIS — Z79899 Other long term (current) drug therapy: Secondary | ICD-10-CM | POA: Insufficient documentation

## 2015-04-28 DIAGNOSIS — Z87891 Personal history of nicotine dependence: Secondary | ICD-10-CM | POA: Insufficient documentation

## 2015-04-28 DIAGNOSIS — Y990 Civilian activity done for income or pay: Secondary | ICD-10-CM | POA: Insufficient documentation

## 2015-04-28 DIAGNOSIS — Y9289 Other specified places as the place of occurrence of the external cause: Secondary | ICD-10-CM | POA: Insufficient documentation

## 2015-04-28 DIAGNOSIS — W01198A Fall on same level from slipping, tripping and stumbling with subsequent striking against other object, initial encounter: Secondary | ICD-10-CM | POA: Insufficient documentation

## 2015-04-28 DIAGNOSIS — M25569 Pain in unspecified knee: Secondary | ICD-10-CM

## 2015-04-28 DIAGNOSIS — S8991XA Unspecified injury of right lower leg, initial encounter: Secondary | ICD-10-CM | POA: Insufficient documentation

## 2015-04-28 MED ORDER — HYDROCODONE-ACETAMINOPHEN 5-325 MG PO TABS: 1.0000 | ORAL_TABLET | ORAL | Status: DC | PRN

## 2015-04-28 NOTE — ED Notes (Signed)
Declined W/C at D/C and was escorted to lobby by RN. 

## 2015-04-28 NOTE — ED Provider Notes (Signed)
CSN: [id number]     Arrival date & time 04/28/15  1241 History  This chart was scribed for non-physician practitioner, Montine Circle, PA-C working with Leonard Schwartz, MD by Rayna Sexton, ED scribe. This patient was seen in room TR11C/TR11C and the patient's care was started at 1:36 PM.     Chief Complaint  Patient presents with  . Fall   The history is provided by the patient. No language interpreter was used.    HPI Comments: Sarah Singh is a 49 y.o. female who presents to the Emergency Department complaining of a fall that occurred earlier today at work. Pt notes slipping and falling on a grape and landing on her right knee. She notes associated, constant, moderate, pain and swelling to her right knee and further notes not being able to ambulate s/p the incident. Pt rates the pain as an 8/10. She denies any other areas of pain or associated symptoms.   History reviewed. No pertinent past medical history. Past Surgical History  Procedure Laterality Date  . Tubal ligation    .  tm tubes      Family History  Problem Relation Age of Onset  . Hypertension Mother   . Diabetes Sister   . Cancer Brother     throat cancer    History  Substance Use Topics  . Smoking status: Former Smoker -- 25 years  . Smokeless tobacco: Not on file  . Alcohol Use: No   OB History    No data available     Review of Systems  Constitutional: Negative for fever and chills.  Respiratory: Negative for shortness of breath.   Cardiovascular: Negative for chest pain.  Gastrointestinal: Negative for nausea, vomiting, diarrhea and constipation.  Genitourinary: Negative for dysuria.  Musculoskeletal: Positive for myalgias, joint swelling and arthralgias.  Skin: Negative for color change, pallor and wound.      Allergies  Other  Home Medications   Prior to Admission medications   Medication Sig Start Date End Date Taking? Authorizing Provider  carbamide peroxide (DEBROX) 6.5 % otic solution  Place 5 drops into the right ear 2 (two) times daily. 12/18/14   Lorayne Marek, MD  cyclobenzaprine (FLEXERIL) 5 MG tablet Take 1 tablet (5 mg total) by mouth 3 (three) times daily as needed for muscle spasms. 05/11/14   Evelina Bucy, MD  ibuprofen (ADVIL,MOTRIN) 200 MG tablet Take 200 mg by mouth every 6 (six) hours as needed for mild pain.    Historical Provider, MD  ibuprofen (ADVIL,MOTRIN) 800 MG tablet Take 1 tablet (800 mg total) by mouth every 8 (eight) hours as needed. 05/11/14   Evelina Bucy, MD  Vitamin D, Ergocalciferol, (DRISDOL) 50000 UNITS CAPS capsule Take 1 capsule (50,000 Units total) by mouth every 7 (seven) days. 12/20/14   Lorayne Marek, MD   BP 135/72 mmHg  Pulse 62  Temp(Src) 98 F (36.7 C) (Oral)  Resp 16  SpO2 100%  LMP 04/28/2015 Physical Exam  Constitutional: She is oriented to person, place, and time. She appears well-developed and well-nourished. No distress.  HENT:  Head: Normocephalic and atraumatic.  Mouth/Throat: Oropharynx is clear and moist.  Eyes: Conjunctivae and EOM are normal. Pupils are equal, round, and reactive to light.  Neck: Normal range of motion. Neck supple. No tracheal deviation present.  Cardiovascular: Normal rate.   Pulmonary/Chest: Breath sounds normal. No respiratory distress.  Abdominal: Soft.  Musculoskeletal:  Right knee tender to palpation, range of motion and strength limited secondary to pain, no bony  abnormality or deformity, minimal swelling  Neurological: She is alert and oriented to person, place, and time.  Skin: Skin is warm and dry.  Psychiatric: She has a normal mood and affect. Her behavior is normal.  Nursing note and vitals reviewed.   ED Course  Procedures  DIAGNOSTIC STUDIES: Oxygen Saturation is 100% on RA, normal by my interpretation.    COORDINATION OF CARE: 1:38 PM Discussed treatment plan with pt at bedside and pt agreed to plan.   Labs Review Labs Reviewed - No data to display  Imaging Review Dg Knee  Complete 4 Views Right  04/28/2015   CLINICAL DATA:  Pain after falling by slipping on a grape.  EXAM: RIGHT KNEE - COMPLETE 4+ VIEW  COMPARISON:  None.  FINDINGS: There is no evidence of fracture, dislocation, or joint effusion. There is no evidence of arthropathy or other focal bone abnormality. Soft tissues are unremarkable.  IMPRESSION: Normal   Electronically Signed   By: Nelson Chimes M.D.   On: 04/28/2015 14:23     EKG Interpretation None      MDM   Final diagnoses:  Knee pain    Patient with a fall on her knee after slipping on a grape at work. Plain films of the knee are negative. Will give the patient a knee immobilizer and crutches and pain medicine. Recommend follow-up with orthopedics. Patient understands and agrees to plan. She is stable and ready for discharge.  I personally performed the services described in this documentation, which was scribed in my presence. The recorded information has been reviewed and is accurate.  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 Recommend OTC ibuprofen and Tylenol.   Montine Circle, PA-C 04/28/15 [place], PA-C 04/28/15 1438  Leonard Schwartz, MD 04/29/15 [phone]

## 2015-04-28 NOTE — ED Notes (Signed)
Pt reports stepping on a grape and fell straight onto RT knee. Pt reports pain 8/10 ,limited movement of RT knee and swelling to knee.

## 2015-04-28 NOTE — Discharge Instructions (Signed)

## 2015-06-19 ENCOUNTER — Encounter (HOSPITAL_COMMUNITY): Payer: Self-pay | Admitting: Cardiology

## 2015-06-19 ENCOUNTER — Emergency Department (HOSPITAL_COMMUNITY)
Admission: EM | Admit: 2015-06-19 | Discharge: 2015-06-19 | Disposition: A | Discharge status: Home / Self Care | Payer: Self-pay | Attending: Emergency Medicine | Admitting: Emergency Medicine

## 2015-06-19 DIAGNOSIS — M546 Pain in thoracic spine: Secondary | ICD-10-CM | POA: Insufficient documentation

## 2015-06-19 DIAGNOSIS — Z79899 Other long term (current) drug therapy: Secondary | ICD-10-CM | POA: Insufficient documentation

## 2015-06-19 DIAGNOSIS — R350 Frequency of micturition: Secondary | ICD-10-CM | POA: Insufficient documentation

## 2015-06-19 DIAGNOSIS — Z87891 Personal history of nicotine dependence: Secondary | ICD-10-CM | POA: Insufficient documentation

## 2015-06-19 DIAGNOSIS — M549 Dorsalgia, unspecified: Secondary | ICD-10-CM

## 2015-06-19 DIAGNOSIS — Z87828 Personal history of other (healed) physical injury and trauma: Secondary | ICD-10-CM | POA: Insufficient documentation

## 2015-06-19 DIAGNOSIS — Z3202 Encounter for pregnancy test, result negative: Secondary | ICD-10-CM | POA: Insufficient documentation

## 2015-06-19 DIAGNOSIS — M545 Low back pain: Secondary | ICD-10-CM | POA: Insufficient documentation

## 2015-06-19 LAB — URINALYSIS, ROUTINE W REFLEX MICROSCOPIC
Bilirubin Urine: NEGATIVE
Glucose, UA: NEGATIVE mg/dL
Ketones, ur: NEGATIVE mg/dL
LEUKOCYTES UA: NEGATIVE
Nitrite: NEGATIVE
PROTEIN: NEGATIVE mg/dL
SPECIFIC GRAVITY, URINE: 1.018 (ref 1.005–1.030)
Urobilinogen, UA: 1 mg/dL (ref 0.0–1.0)
pH: 6.5 (ref 5.0–8.0)

## 2015-06-19 LAB — URINE MICROSCOPIC-ADD ON

## 2015-06-19 LAB — I-STAT CHEM 8, ED
BUN: 12 mg/dL (ref 6–20)
Calcium, Ion: 1.19 mmol/L (ref 1.12–1.23)
Chloride: 105 mmol/L (ref 101–111)
Creatinine, Ser: 0.6 mg/dL (ref 0.44–1.00)
Glucose, Bld: 76 mg/dL (ref 65–99)
HCT: 39 % (ref 36.0–46.0)
Hemoglobin: 13.3 g/dL (ref 12.0–15.0)
POTASSIUM: 3.8 mmol/L (ref 3.5–5.1)
SODIUM: 143 mmol/L (ref 135–145)
TCO2: 24 mmol/L (ref 0–100)

## 2015-06-19 LAB — POC URINE PREG, ED: Preg Test, Ur: NEGATIVE

## 2015-06-19 MED ORDER — IBUPROFEN 600 MG PO TABS: 600.0000 mg | ORAL_TABLET | Freq: Four times a day (QID) | ORAL | Status: DC | PRN

## 2015-06-19 MED ORDER — METHOCARBAMOL 500 MG PO TABS
1000.0000 mg | ORAL_TABLET | Freq: Once | ORAL | Status: AC
  Administered 2015-06-19: 1000 mg via ORAL
  Filled 2015-06-19: qty 2

## 2015-06-19 MED ORDER — METHOCARBAMOL 500 MG PO TABS: 500.0000 mg | ORAL_TABLET | Freq: Four times a day (QID) | ORAL | Status: DC | PRN

## 2015-06-19 NOTE — ED Notes (Signed)
Pt states that she woke up yesterday morning with her lower back hurting pt denies any injury or fall.

## 2015-06-19 NOTE — ED Notes (Signed)
Pt reports she fell a couple of weeks ago at work and is having lower back pain.

## 2015-06-19 NOTE — Discharge Instructions (Signed)
Read the information below.  Use the prescribed medication as directed.  Please discuss all new medications with your pharmacist.  You may return to the Emergency Department at any time for worsening condition or any new symptoms that concern you.   If you develop fevers, loss of control of bowel or bladder, weakness or numbness in your legs, or are unable to walk, return to the ER for a recheck.    Back Pain, Adult Low back pain is very common. About 1 in 5 people have back pain.The cause of low back pain is rarely dangerous. The pain often gets better over time.About half of people with a sudden onset of back pain feel better in just 2 weeks. About 8 in 10 people feel better by 6 weeks.  CAUSES Some common causes of back pain include:  Strain of the muscles or ligaments supporting the spine.  Wear and tear (degeneration) of the spinal discs.  Arthritis.  Direct injury to the back. DIAGNOSIS Most of the time, the direct cause of low back pain is not known.However, back pain can be treated effectively even when the exact cause of the pain is unknown.Answering your caregiver's questions about your overall health and symptoms is one of the most accurate ways to make sure the cause of your pain is not dangerous. If your caregiver needs more information, he or she may order lab work or imaging tests (X-rays or MRIs).However, even if imaging tests show changes in your back, this usually does not require surgery. HOME CARE INSTRUCTIONS For many people, back pain returns.Since low back pain is rarely dangerous, it is often a condition that people can learn to Sarah Singh their own.   Remain active. It is stressful on the back to sit or stand in one place. Do not sit, drive, or stand in one place for more than 30 minutes at a time. Take short walks on level surfaces as soon as pain allows.Try to increase the length of time you walk each day.  Do not stay in bed.Resting more than 1 or 2 days can  delay your recovery.  Do not avoid exercise or work.Your body is made to move.It is not dangerous to be active, even though your back may hurt.Your back will likely heal faster if you return to being active before your pain is gone.  Pay attention to your body when you bend and lift. Many people have less discomfortwhen lifting if they bend their knees, keep the load close to their bodies,and avoid twisting. Often, the most comfortable positions are those that put less stress on your recovering back.  Find a comfortable position to sleep. Use a firm mattress and lie on your side with your knees slightly bent. If you lie on your back, put a pillow under your knees.  Only take over-the-counter or prescription medicines as directed by your caregiver. Over-the-counter medicines to reduce pain and inflammation are often the most helpful.Your caregiver may prescribe muscle relaxant drugs.These medicines help dull your pain so you can more quickly return to your normal activities and healthy exercise.  Put ice on the injured area.  Put ice in a plastic bag.  Place a towel between your skin and the bag.  Leave the ice on for 15-20 minutes, 03-04 times a day for the first 2 to 3 days. After that, ice and heat may be alternated to reduce pain and spasms.  Ask your caregiver about trying back exercises and gentle massage. This may be of some  benefit.  Avoid feeling anxious or stressed.Stress increases muscle tension and can worsen back pain.It is important to recognize when you are anxious or stressed and learn ways to manage it.Exercise is a great option. SEEK MEDICAL CARE IF:  You have pain that is not relieved with rest or medicine.  You have pain that does not improve in 1 week.  You have new symptoms.  You are generally not feeling well. SEEK IMMEDIATE MEDICAL CARE IF:   You have pain that radiates from your back into your legs.  You develop new bowel or bladder control  problems.  You have unusual weakness or numbness in your arms or legs.  You develop nausea or vomiting.  You develop abdominal pain.  You feel faint. Document Released: 10/18/2005 Document Revised: 04/18/2012 Document Reviewed: 02/19/2014 [facility] Patient Information 2015 [place], [place]. This information is not intended to replace advice given to you by your health care provider. Make sure you discuss any questions you have with your health care provider.    Emergency Department Resource Guide 1) Find a Doctor and Pay Out of Pocket Although you won't have to find out who is covered by your insurance plan, it is a good idea to ask around and get recommendations. You will then need to call the office and see if the doctor you have chosen will accept you as a new patient and what types of options they offer for patients who are self-pay. Some doctors offer discounts or will set up payment plans for their patients who do not have insurance, but you will need to ask so you aren't surprised when you get to your appointment.  2) Contact Your Local Health Department Not all health departments have doctors that can see patients for sick visits, but many do, so it is worth a call to see if yours does. If you don't know where your local health department is, you can check in your phone book. The CDC also has a tool to help you locate your state's health department, and many state websites also have listings of all of their local health departments.  3) Find a [facility] If your illness is not likely to be very severe or complicated, you may want to try a walk in clinic. These are popping up all over the country in pharmacies, drugstores, and shopping centers. They're usually staffed by nurse practitioners or physician assistants that have been trained to treat common illnesses and complaints. They're usually fairly quick and inexpensive. However, if you have serious medical issues or chronic medical  problems, these are probably not your best option.  No Primary Care Doctor: - Call Health Connect at  [phone] - they can help you locate a primary care doctor that  accepts your insurance, provides certain services, etc. - Physician Referral Service- [phone]  Chronic Pain Problems: Organization         Address  Phone   Notes  [facility]  [phone] Patients need to be referred by their primary care doctor.   Medication Assistance: Organization         Address  Phone   Notes  [facility] Medication [facility] [address] [place], [place] [address] [phone] --Must be a resident of [facility] -- Must have NO insurance coverage whatsoever (no Medicaid/ Medicare, etc.) -- The pt. MUST have a primary care doctor that directs their care regularly and follows them in the community   MedAssist  (866)  [place]  [phone]    Agencies that provide inexpensive medical care: Organization         Address  Phone   Notes  [place]  [phone]   [facility]    [phone]   [facility] [place], [place] [address] [phone]   Ema [facility]ner [place] [address], [place] [phone]   Planned Parenthood    [phone]   [facility]    [phone]   [place] and [place], [place] Phone:  [phone], Fax:  [phone] Hours of Operation:  9 am - 6 pm, M-F.  Also accepts Medicaid/Medicare and self-pay.  [facility] for [address], [address], [place] Phone: [phone], Fax: [phone]. Hours of Operation:  8:30 am - 5:30 pm, M-F.  Also accepts Medicaid and self-pay.  [facility] [place] [address] [place] Phone: [phone]   [place], [place], [place] [phone], Ext. [phone]  Mondays & Thursdays: 7-9 AM.  First 15 patients are seen on a first come, first serve basis.    [place] Providers:  Organization         Address  Phone   Notes  [facility] [address], [place] [phone] Also accepts self-pay patients.  [facility] [address], [place]  [phone]   [place], [address], [place] [phone]   [facility] [address], [place] [phone]   Lucianne Lei [address], [place]   [phone] Only accepts [place] Access [place] patients after they have their name applied to their card.   Self-Pay (no insurance) in [facility]:  Organization         Address  Phone   Notes  Sickle Cell Patients, [facility] Internal Medicine [place] [phone]   [facility] Urgent Care [place] [phone]   [facility] [place]  [place], [place], [place] [phone]   [facility]/Dr. Osei-Bonsu  [address] [place] or [address], [place] [phone] Phone number for both [place] and [place] locations is the same.  [facility] [address] [place] [phone]   [facility] [address], [place] or [address] [phone]   [facility] [address], [place] [phone], phone; [phone], fax Sees patients 1st and 3rd Saturday of every month.  Must not qualify for public or private insurance (i.e. Medicaid, Medicare, [place] Health Choice, Veterans' Benefits)  Household income should be no more than 200% of the poverty level The clinic cannot treat you if you are pregnant or think you are pregnant  Sexually transmitted diseases are not treated at  the clinic.    Dental Care: Organization         Address  Phone  Notes  [facility] Department of [facility] [phone] Accepts children up to age 7 who are enrolled in [place] or [place]; pregnant women with  a Medicaid card; and children who have applied for Medicaid or [place] Health Choice, but were declined, whose parents can pay a reduced fee at time of service.  [facility] Department of [facility]  [address], [place] [phone] Accepts children up to age 52 who are enrolled in [place] or [place]; pregnant women with a Medicaid card; and children who have applied for Medicaid or [place] Health Choice, but were declined, whose parents can pay a reduced fee at time of service.  [place] Adult Dental Access PROGRAM  [place] [phone] Patients are seen by appointment only. Walk-ins are not accepted. [place] will see patients 60 years of age and older. Monday - Tuesday (8am-5pm) Most Wednesdays (8:30-5pm) $30 per visit, cash only  [facility] Adult Dental Access PROGRAM  [address], [facility] [phone] Patients are seen by appointment only. Walk-ins are not accepted. [place] will see patients 35 years of age and older. One Wednesday Evening (Monthly: Volunteer Based).  $30 per visit, cash only  [place]  [phone] for adults; Children under age 4, call Graduate Pediatric Dentistry at [phone]. Children aged 58-14, please call [phone] to request a pediatric application.  Dental services are provided in all areas of dental care including fillings, crowns and bridges, complete and partial dentures, implants, gum treatment, root canals, and extractions. Preventive care is also provided. Treatment is provided to both adults and children. Patients are selected via a lottery and there is often a  waiting list.   [facility] [address], [place]  [phone] [email or web address]   [facility] [address] [place], [place] [phone], Ext. [phone] Second and Fourth Thursday of each month, opens at 6:30 AM; Clinic ends at 9 AM.  Patients are seen on a first-come first-served basis, and a limited number are seen during each clinic.   [facility]  [address] Hillard Danker [place], [place] [phone]   Eligibility Requirements You must have lived in [place], [place], or [place] counties for at least the last three months.   You cannot be eligible for state or federal sponsored [employer], including [employer], [place], or [employer].   You generally cannot be eligible for healthcare insurance through your employer.    How to apply: Eligibility screenings are held every Tuesday and Wednesday afternoon from 1:00 pm until 4:00 pm. You do not need an appointment for the interview!  [facility] [address] [place], [place]   [place]  [place] Department  [place]  [phone]    Behavioral Health Resources in the Community: Intensive Outpatient Programs Organization         Address  Phone  Notes  [place]. [address] [place], [place] [phone]   [facility] [address], [place], [place]   ADS: Alcohol & Drug Svcs [address] [place], [place]   [place] [address]  [place], [place] or [phone]   Substance Abuse Resources Organization         Address  Phone  Notes  Alcohol and Drug Services  [place]  [phone]   The [place]   [facility]  [phone]  Residential & Outpatient Substance Abuse  Program  [phone]   Psychological Services Organization         Address  Phone  Notes  [facility] [place]  [place]  [phone]   [place] [phone] N[address] [place] or [phone]    Mobile Crisis Teams Organization         Address  Phone  Notes  [facility], Mobile Crisis Care Unit  [phone]   [facility] Psychotherapeutic Services  [address] [place], [place]   [place] Levels [address] [place] [phone]    Self-Help/Support Groups Organization         Address  Phone             Notes  Edith Endave. of [place] - variety of support groups  [place] Call for more information  Narcotics Anonymous (NA), Caring Services [address], [employer] [place]  2 meetings at this location   Special educational needs teacher         Address  Phone  Notes  [facility]AP Residential Treatment [place],    [place]  [phone]   [facility]  [address] [place] [address]76195, [place], [place]   [address] [place], [place] [phone] Admissions: 8am-3pm M-F  Incentives Substance [place] [address],    [place], [place] [phone]   [facility] [address] [place], [place], [place]   [facility] [address],  [place], [place]   [facility] Programs - Intensive Outpatient [address], [place], [place]   [facility] [place].,  [place], [place] [phone] or [phone]   [facility] Services (RTS) [address], [place], [place] Accepts Medicaid  [facility] [address]  [place] [phone] Substance Abuse/Addiction Treatment   [facility] Organization          Address  Phone  Notes  [facility]  [phone]   Domenic Schwab, PhD [address] Arlis [place], [place]   [phone] or [phone]   [place] [address] [place], [place] [phone]   [facility] [address] [place], [place] [phone] Insurance/Medicaid/sponsorship through [facility] and Families [address] [place]                                    [place], [place] [phone] [place] [address], [place] [phone]    Dr. Adele Schilder  [phone]   [facility] [place] Dept. 1) [address] [place] 2) [place] 3)  [place] [address], [place] [phone]  [phone]   [place] [phone] or [phone] (After Hours)

## 2015-06-19 NOTE — ED Provider Notes (Signed)
CSN: [id number]     Arrival date & time 06/19/15  0957 History  This chart was scribed for non-physician practitioner Clayton Bibles, PA-C, working with Virgel Manifold, MD by Zola Button, ED Scribe. This patient was seen in room TR09C/TR09C and the patient's care was started at 10:22 AM.      Chief Complaint  Patient presents with  . Back Pain   The history is provided by the patient. No language interpreter was used.   HPI Comments: Sarah Singh is a 49 y.o. female who presents to the Emergency Department complaining of mid back pain that started yesterday after she woke up. The pain is worse with movement. She has taken Advil and Aleve for the pain but without relief. She reports having urinary frequency that started a few months ago. Patient denies leg weakness or numbness, loss of bowel or bladder control, abdominal pain, dysuria, diarrhea, constipation, blood in stool, vaginal discharge, vaginal bleeding, vomiting, and fever. She denies new activity, new mattress, fall or injury, history of cancer, history of DM, and IV drug use. Patient denies any known medical problems. She was seen 6/27 for a fall at work and was complaining of right knee pain at the time. She is unsure if the fall is related to her back pain but she didn't have any pain until two days ago.  History reviewed. No pertinent past medical history. Past Surgical History  Procedure Laterality Date  . Tubal ligation    .  tm tubes      Family History  Problem Relation Age of Onset  . Hypertension Mother   . Diabetes Sister   . Cancer Brother     throat cancer    Social History  Substance Use Topics  . Smoking status: Former Smoker -- 25 years  . Smokeless tobacco: None  . Alcohol Use: No   OB History    No data available     Review of Systems  Constitutional: Negative for fever and activity change.  Gastrointestinal: Negative for abdominal pain, diarrhea, constipation and blood in stool.  Genitourinary: Positive for  frequency. Negative for dysuria, vaginal bleeding, vaginal discharge and enuresis.  Musculoskeletal: Positive for back pain.  Skin: Negative for rash.  Allergic/Immunologic: Negative for immunocompromised state.  Neurological: Negative for weakness and numbness.  Hematological: Does not bruise/bleed easily.      Allergies  Other  Home Medications   Prior to Admission medications   Medication Sig Start Date End Date Taking? Authorizing Provider  carbamide peroxide (DEBROX) 6.5 % otic solution Place 5 drops into the right ear 2 (two) times daily. 12/18/14   Lorayne Marek, MD  cyclobenzaprine (FLEXERIL) 5 MG tablet Take 1 tablet (5 mg total) by mouth 3 (three) times daily as needed for muscle spasms. 05/11/14   Evelina Bucy, MD  HYDROcodone-acetaminophen (NORCO/VICODIN) 5-325 MG per tablet Take 1 tablet by mouth every 4 (four) hours as needed. 04/28/15   Montine Circle, PA-C  ibuprofen (ADVIL,MOTRIN) 200 MG tablet Take 200 mg by mouth every 6 (six) hours as needed for mild pain.    Historical Provider, MD  ibuprofen (ADVIL,MOTRIN) 800 MG tablet Take 1 tablet (800 mg total) by mouth every 8 (eight) hours as needed. 05/11/14   Evelina Bucy, MD  Vitamin D, Ergocalciferol, (DRISDOL) 50000 UNITS CAPS capsule Take 1 capsule (50,000 Units total) by mouth every 7 (seven) days. 12/20/14   Lorayne Marek, MD   BP 146/105 mmHg  Pulse 80  Temp(Src) 98.2 F (36.8 C) (Oral)  Resp 18  Wt 243 lb (110.224 kg)  SpO2 99%  LMP 04/21/2015 Physical Exam  Constitutional: She appears well-developed and well-nourished. No distress.  HENT:  Head: Normocephalic and atraumatic.  Neck: Neck supple.  Pulmonary/Chest: Effort normal.  Abdominal: Soft. She exhibits no distension and no mass. There is no tenderness. There is no rebound and no guarding.  obese  Musculoskeletal:  Spine nontender, no crepitus, or stepoffs. Lower extremities:  Strength 5/5, sensation intact, distal pulses intact.   Diffuse tenderness  over musculature of mid and lower back bilaterally.  Neurological: She is alert. She exhibits normal muscle tone.  Gait is normal  Skin: She is not diaphoretic.  Psychiatric: She has a normal mood and affect. Her behavior is normal.  Nursing note and vitals reviewed.   ED Course  Procedures  DIAGNOSTIC STUDIES: Oxygen Saturation is 99% on room air, normal by my interpretation.    COORDINATION OF CARE: 10:27 AM-Discussed treatment plan which includes labs with patient/guardian at bedside and patient/guardian agreed to plan.    Labs Review Labs Reviewed  URINALYSIS, ROUTINE W REFLEX MICROSCOPIC (NOT AT [facility]) - Abnormal; Notable for the following:    APPearance CLOUDY (*)    Hgb urine dipstick TRACE (*)    All other components within normal limits  URINE MICROSCOPIC-ADD ON - Abnormal; Notable for the following:    Squamous Epithelial / LPF MANY (*)    All other components within normal limits  URINE CULTURE  I-STAT CHEM 8, ED  POC URINE PREG, ED    Imaging Review No results found. I have personally reviewed and evaluated these images and lab results as part of my medical decision-making.   EKG Interpretation None      MDM   Final diagnoses:  Bilateral back pain, unspecified location    Afebrile, nontoxic patient with mechanical low back pain. No red flags.  Chem 8 unremarkable.  UA significant for trace hematuria.  Pt is unsure why she might have hematuria - she has had intermittent vaginal bleeding with irregular periods but hasn't noticed any recently.  I clinically doubt ureteral stone.  Since she has urinary frequency will send for culture - could be caused by UTI.   Pt is hypertensive, will need to have this rechecked. I have encouraged her to follow up with PCP for recheck of back, hematuria, and for blood pressure recheck.  I have left a message with the community liaison Saintclair Halsted to help patient with follow up.  D/C home with robaxin, ibuprofen, PCP resources  for follow up.  Discussed result, findings, treatment, and follow up  with patient.  Pt given return precautions.  Pt verbalizes understanding and agrees with plan.      I personally performed the services described in this documentation, which was scribed in my presence. The recorded information has been reviewed and is accurate.    Clayton Bibles, PA-C 06/19/15 [place], MD 06/20/15 [phone]

## 2015-06-20 LAB — URINE CULTURE: Culture: 5000

## 2015-10-01 ENCOUNTER — Encounter: Payer: Self-pay | Admitting: Internal Medicine

## 2015-10-01 ENCOUNTER — Ambulatory Visit (INDEPENDENT_AMBULATORY_CARE_PROVIDER_SITE_OTHER): Payer: Self-pay | Admitting: Internal Medicine

## 2015-10-01 VITALS — BP 120/88 | HR 72 | Ht 62.0 in | Wt 238.0 lb

## 2015-10-01 DIAGNOSIS — B9689 Other specified bacterial agents as the cause of diseases classified elsewhere: Secondary | ICD-10-CM

## 2015-10-01 DIAGNOSIS — A499 Bacterial infection, unspecified: Secondary | ICD-10-CM

## 2015-10-01 DIAGNOSIS — N760 Acute vaginitis: Secondary | ICD-10-CM

## 2015-10-01 MED ORDER — METRONIDAZOLE 500 MG PO TABS: ORAL_TABLET | ORAL | Status: DC

## 2015-10-01 NOTE — Patient Instructions (Addendum)
Call if discharge does not resolve No alcohol when taking the medication  Drink a glass of water before every meal Drink 6-8 glasses of water daily Eat three meals daily Eat a protein and healthy fat with every meal (eggs,fish, chicken, [place] and limit red meats) Eat 5 servings of vegetables daily, mix the colors Eat 2 servings of fruit daily with skin, if skin is edible Use smaller plates Put food/utensils down as you chew and swallow each bite Eat at a table with friends/family at least once daily, no TV Do not eat in front of the TV

## 2015-10-01 NOTE — Progress Notes (Signed)
   Subjective:    Patient ID: Sarah Singh, female    DOB: 02-02-1966, 49 y.o.   MRN: [id number]  HPI Yellow vaginal discharge with fishy odor for about 2 weeks.  Feels like she has to urinate all the time.  Some itching.  No pain.  Did have Bacterial Vaginosis last visit in the summer.  No pelvic pain. States when she took the Metronidazole previously the discharge resolved.     Review of Systems     Objective:   Physical Exam NAD Abd:  S, NT, No HSM or mass appreciated, +BS Pelvic:  NOrmal external genitalia.  No vaginal or cervical mucosal erythema, thick light yellow discharge.  + whiff--mild.  No CMT.      Assessment & Plan:  Bacterial Vaginosis:  Metronidazole for 7 days.  Encouraged pt to avoid douching.  Obesity:  Pt. To follow up in next 3 months to discuss lifestyle changes more in depth.  Information to get started given.

## 2015-12-30 ENCOUNTER — Ambulatory Visit: Payer: Self-pay | Admitting: Internal Medicine

## 2016-07-17 ENCOUNTER — Encounter (HOSPITAL_COMMUNITY): Payer: Self-pay

## 2016-07-17 ENCOUNTER — Emergency Department (HOSPITAL_COMMUNITY)
Admission: EM | Admit: 2016-07-17 | Discharge: 2016-07-17 | Disposition: A | Discharge status: Home / Self Care | Payer: BLUE CROSS/BLUE SHIELD | Attending: Emergency Medicine | Admitting: Emergency Medicine

## 2016-07-17 DIAGNOSIS — W1839XA Other fall on same level, initial encounter: Secondary | ICD-10-CM | POA: Insufficient documentation

## 2016-07-17 DIAGNOSIS — Y929 Unspecified place or not applicable: Secondary | ICD-10-CM | POA: Diagnosis not present

## 2016-07-17 DIAGNOSIS — Y939 Activity, unspecified: Secondary | ICD-10-CM | POA: Diagnosis not present

## 2016-07-17 DIAGNOSIS — Z87891 Personal history of nicotine dependence: Secondary | ICD-10-CM | POA: Insufficient documentation

## 2016-07-17 DIAGNOSIS — Y990 Civilian activity done for income or pay: Secondary | ICD-10-CM | POA: Diagnosis not present

## 2016-07-17 DIAGNOSIS — S39012A Strain of muscle, fascia and tendon of lower back, initial encounter: Secondary | ICD-10-CM | POA: Insufficient documentation

## 2016-07-17 DIAGNOSIS — S3992XA Unspecified injury of lower back, initial encounter: Secondary | ICD-10-CM | POA: Diagnosis present

## 2016-07-17 DIAGNOSIS — R200 Anesthesia of skin: Secondary | ICD-10-CM | POA: Insufficient documentation

## 2016-07-17 MED ORDER — NAPROXEN 500 MG PO TABS: 500.0000 mg | ORAL_TABLET | Freq: Two times a day (BID) | ORAL | 0 refills | Status: DC

## 2016-07-17 MED ORDER — CYCLOBENZAPRINE HCL 10 MG PO TABS
10.0000 mg | ORAL_TABLET | Freq: Once | ORAL | Status: AC
  Administered 2016-07-17: 10 mg via ORAL
  Filled 2016-07-17: qty 1

## 2016-07-17 MED ORDER — KETOROLAC TROMETHAMINE 60 MG/2ML IM SOLN
60.0000 mg | Freq: Once | INTRAMUSCULAR | Status: AC
  Administered 2016-07-17: 60 mg via INTRAMUSCULAR
  Filled 2016-07-17: qty 2

## 2016-07-17 MED ORDER — CYCLOBENZAPRINE HCL 10 MG PO TABS: 10.0000 mg | ORAL_TABLET | Freq: Three times a day (TID) | ORAL | 0 refills | Status: DC | PRN

## 2016-07-17 NOTE — ED Provider Notes (Signed)
[facility] DEPT Provider Note   CSN: [id number] Arrival date & time: 07/17/16  1127     History   Chief Complaint Chief Complaint  Patient presents with  . Back Pain  . Tingling    HPI Sarah Singh is a 50 y.o. female.  HPI  50 year old female presents with lower back pain for the past 2 days. 3 days ago she slipped and fell at work. She landed on her right knee. No further knee pain. However the next day she woke up with back pain. Back pain is worst when she is first standing up and try to get up to walk. When she is up and walking around it is much better. It is across heard diffusely lower back. Does not radiate. No fevers, bowel incontinence, weakness, or numbness in her lower extremities. She has tried ibuprofen with no relief. 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 She also states that she has had right arm and hand numbness for the past several years. Worse when she bends her wrist. Is not currently worse or different. No weakness. No direct trauma to her back during the fall.  Past Medical History:  Diagnosis Date  . Vitamin D deficiency 2016    Patient Active Problem List   Diagnosis Date Noted  . Wax in ear 12/18/2014  . Hot flashes 12/18/2014  . Prediabetes 12/18/2014    Past Surgical History:  Procedure Laterality Date  .  TM tubes     . TUBAL LIGATION  1992    OB History    Gravida Para Term Preterm AB Living   2 2       2    SAB TAB Ectopic Multiple Live Births                   Home Medications    Prior to Admission medications   Medication Sig Start Date End Date Taking? Authorizing Provider  cyclobenzaprine (FLEXERIL) 10 MG tablet Take 1 tablet (10 mg total) by mouth 3 (three) times daily as needed for muscle spasms. 07/17/16   Sherwood Gambler, MD  metroNIDAZOLE (FLAGYL) 500 MG tablet 1 tab by mouth with food twice daily for 7 days. 10/01/15   Mack Hook, MD  naproxen (NAPROSYN) 500 MG tablet Take 1 tablet  (500 mg total) by mouth 2 (two) times daily. 07/17/16   Sherwood Gambler, MD    Family History Family History  Problem Relation Age of Onset  . Hypertension Mother   . Diabetes Sister   . Cancer Brother 55    throat cancer   . Cancer Father     Social History Social History  Substance Use Topics  . Smoking status: Former Smoker    Types: Cigarettes    Start date: 03/08/1980    Quit date: 03/08/2013  . Smokeless tobacco: Never Used  . Alcohol use No     Comment: History of abuse.  Stopped in 2011     Allergies   Other   Review of Systems Review of Systems  Constitutional: Negative for fever.  Gastrointestinal: Negative for abdominal pain.  Genitourinary:       No incontinence  Musculoskeletal: Positive for back pain.  Neurological: Positive for numbness (chronic). Negative for weakness.  All other systems reviewed and are negative.    Physical Exam Updated Vital Signs BP 136/78 (BP Location: Right Arm)   Pulse 75   Temp 98.1 F (36.7 C) (Oral)   Resp 18  Ht 5\' 2"  (1.575 m)   Wt 237 lb (107.5 kg)   SpO2 99%   BMI 43.35 kg/m   Physical Exam  Constitutional: She is oriented to person, place, and time. She appears well-developed and well-nourished.  obese  HENT:  Head: Normocephalic and atraumatic.  Right Ear: External ear normal.  Left Ear: External ear normal.  Nose: Nose normal.  Eyes: Right eye exhibits no discharge. Left eye exhibits no discharge.  Pulmonary/Chest: Effort normal.  Abdominal: Soft. She exhibits no distension. There is no tenderness.  Musculoskeletal:       Thoracic back: She exhibits no tenderness.       Lumbar back: She exhibits tenderness (diffuse lower back tenderness) and bony tenderness.  Neurological: She is alert and oriented to person, place, and time.  5/5 strength in BLE. Normal gross sensation. Normal gait Right upper extremity with subjective decreased sensation diffusely in right forearm and hand. No muscle wasting. Normal  strength compared to LUE  Skin: Skin is warm and dry.  Nursing note and vitals reviewed.    ED Treatments / Results  Labs (all labs ordered are listed, but only abnormal results are displayed) Labs Reviewed - No data to display  EKG  EKG Interpretation None       Radiology No results found.  Procedures Procedures (including critical care time)  Medications Ordered in ED Medications  ketorolac (TORADOL) injection 60 mg (60 mg Intramuscular Given 07/17/16 1215)  cyclobenzaprine (FLEXERIL) tablet 10 mg (10 mg Oral Given 07/17/16 1215)     Initial Impression / Assessment and Plan / ED Course  I have reviewed the triage vital signs and the nursing notes.  Pertinent labs & imaging results that were available during my care of the patient were reviewed by me and considered in my medical decision making (see chart for details).  Clinical Course    Patient appears to have a muscular strain. No direct trauma to her back or red flags such as age over 38, cancer history, neurologic dysfunction, or fevers. Treat with naproxen and stop ibuprofen, as well as muscle relaxers. Given Toradol and Flexeril here. Discussed strict return precautions as well as recommending follow-up with her PCP. Unclear exactly why she has chronic right arm numbness but I do not see any acute emergency apparent on her exam or history. No indication for neuroimaging given chronicity. Possibly nerve compression given it is always worse at night or when she bends her elbow or wrist. Advised her to use a wrist brace, especially at night. Another reason to follow-up with PCP. Discussed return precautions.  Final Clinical Impressions(s) / ED Diagnoses   Final diagnoses:  Lumbar strain, initial encounter  Right arm numbness    New Prescriptions Discharge Medication List as of 07/17/2016 12:12 PM    START taking these medications   Details  cyclobenzaprine (FLEXERIL) 10 MG tablet Take 1 tablet (10 mg total) by  mouth 3 (three) times daily as needed for muscle spasms., Starting Sat 07/17/2016, Print    naproxen (NAPROSYN) 500 MG tablet Take 1 tablet (500 mg total) by mouth 2 (two) times daily., Starting Sat 07/17/2016, Print         Sherwood Gambler, MD 07/17/16 [phone]

## 2016-07-17 NOTE — ED Triage Notes (Signed)
Pt reports she fell at work 2 days ago and now has lower back pain. Pt also reports tingling in the arm for months. Pt is alert and oriented. Pt denies falling on her back but that's where she feels pain.

## 2017-03-18 ENCOUNTER — Emergency Department (HOSPITAL_COMMUNITY): Payer: BLUE CROSS/BLUE SHIELD

## 2017-03-18 ENCOUNTER — Encounter (HOSPITAL_COMMUNITY): Payer: Self-pay | Admitting: Emergency Medicine

## 2017-03-18 ENCOUNTER — Emergency Department (HOSPITAL_COMMUNITY)
Admission: EM | Admit: 2017-03-18 | Discharge: 2017-03-18 | Disposition: A | Discharge status: Home / Self Care | Payer: BLUE CROSS/BLUE SHIELD | Attending: Emergency Medicine | Admitting: Emergency Medicine

## 2017-03-18 DIAGNOSIS — R0782 Intercostal pain: Secondary | ICD-10-CM

## 2017-03-18 DIAGNOSIS — M25512 Pain in left shoulder: Secondary | ICD-10-CM

## 2017-03-18 DIAGNOSIS — Z87891 Personal history of nicotine dependence: Secondary | ICD-10-CM | POA: Insufficient documentation

## 2017-03-18 DIAGNOSIS — Z79899 Other long term (current) drug therapy: Secondary | ICD-10-CM | POA: Diagnosis not present

## 2017-03-18 DIAGNOSIS — E876 Hypokalemia: Secondary | ICD-10-CM | POA: Diagnosis not present

## 2017-03-18 LAB — CBC
HCT: 35.7 % — ABNORMAL LOW (ref 36.0–46.0)
HEMOGLOBIN: 11.5 g/dL — AB (ref 12.0–15.0)
MCH: 29.9 pg (ref 26.0–34.0)
MCHC: 32.2 g/dL (ref 30.0–36.0)
MCV: 92.7 fL (ref 78.0–100.0)
Platelets: 308 10*3/uL (ref 150–400)
RBC: 3.85 MIL/uL — AB (ref 3.87–5.11)
RDW: 14 % (ref 11.5–15.5)
WBC: 9.5 10*3/uL (ref 4.0–10.5)

## 2017-03-18 LAB — BASIC METABOLIC PANEL
ANION GAP: 9 (ref 5–15)
BUN: 14 mg/dL (ref 6–20)
CALCIUM: 9.2 mg/dL (ref 8.9–10.3)
CO2: 23 mmol/L (ref 22–32)
Chloride: 107 mmol/L (ref 101–111)
Creatinine, Ser: 0.81 mg/dL (ref 0.44–1.00)
GFR calc non Af Amer: >60 mL/min (ref >60)
Glucose, Bld: 112 mg/dL — ABNORMAL HIGH (ref 65–99)
Potassium: 3.4 mmol/L — ABNORMAL LOW (ref 3.5–5.1)
Sodium: 139 mmol/L (ref 135–145)

## 2017-03-18 LAB — I-STAT TROPONIN, ED: TROPONIN I, POC: 0.01 ng/mL (ref 0.00–0.08)

## 2017-03-18 MED ORDER — NAPROXEN 500 MG PO TABS: 500.0000 mg | ORAL_TABLET | Freq: Two times a day (BID) | ORAL | 0 refills | Status: DC

## 2017-03-18 MED ORDER — POTASSIUM CHLORIDE CRYS ER 20 MEQ PO TBCR
40.0000 meq | EXTENDED_RELEASE_TABLET | Freq: Once | ORAL | Status: AC
  Administered 2017-03-18: 40 meq via ORAL
  Filled 2017-03-18: qty 2

## 2017-03-18 MED ORDER — KETOROLAC TROMETHAMINE 60 MG/2ML IM SOLN
60.0000 mg | Freq: Once | INTRAMUSCULAR | Status: AC
  Administered 2017-03-18: 60 mg via INTRAMUSCULAR
  Filled 2017-03-18: qty 2

## 2017-03-18 MED ORDER — METHOCARBAMOL 500 MG PO TABS: 500.0000 mg | ORAL_TABLET | Freq: Two times a day (BID) | ORAL | 0 refills | Status: DC

## 2017-03-18 NOTE — Discharge Instructions (Signed)
1. Medications: alternate naprosyn and tylenol for pain control, robaxin, usual home medications 2. Treatment: rest, ice, elevate and use brace, drink plenty of fluids, gentle stretching 3. Follow Up: Please followup with your PCP in 1-2 days for discussion of your diagnoses and further evaluation after today's visit; if you do not have a primary care doctor use the resource guide provided to find one; Please return to the ER for worsening symptoms, shortness of breath, exertional symptoms or other concerns

## 2017-03-18 NOTE — ED Notes (Signed)
ED Provider at bedside. 

## 2017-03-18 NOTE — ED Triage Notes (Signed)
Reports having pain in left side of chest that radiates into back and down left arm that started about a month ago.  Denies any n/v or sob.  No hx of cardiac issues.

## 2017-03-18 NOTE — ED Provider Notes (Signed)
[facility] DEPT Provider Note   CSN: [id number] Arrival date & time: 03/18/17  1842     History   Chief Complaint Chief Complaint  Patient presents with  . Chest Pain    HPI Sarah Singh is a 51 y.o. female with no major medical problems presents to the Emergency Department complaining of gradual, persistent, left shoulder and upper chest pain onset 6 weeks ago.  Patient states pain has been constant, waxing and waning but never resolving. Pt reports the pain is sharp in nature and aggravated by palpation, but not By range of motion of the shoulder. Patient denies exertional pain or shortness of breath. She denies shortness of breath at rest, orthopnea or leg swelling. She denies a cardiac history. Patient denies new activities, lifting, falls or other injury to the shoulder. She denies neck pain. She denies numbness or weakness in the left hand or arm. She reports the pain from the left shoulder radiates to the fingertips of her arm. Patient denies fever, chills, cough, congestion, nausea, diaphoresis, weakness, numbness. She states taking naproxen intermittently without relief. She has not seen any physician about this previously. She reports she presents today so she could get "checked out" because she was tired of hurting. Patient reports she does have a primary care with whom she follows regularly.  The history is provided by the patient and medical records. No language interpreter was used.    Past Medical History:  Diagnosis Date  . Vitamin D deficiency 2016    Patient Active Problem List   Diagnosis Date Noted  . Wax in ear 12/18/2014  . Hot flashes 12/18/2014  . Prediabetes 12/18/2014    Past Surgical History:  Procedure Laterality Date  .  TM tubes     . TUBAL LIGATION  1992    OB History    Gravida Para Term Preterm AB Living   2 2       2    SAB TAB Ectopic Multiple Live Births                   Home Medications    Prior to Admission medications     Medication Sig Start Date End Date Taking? Authorizing Provider  cyclobenzaprine (FLEXERIL) 10 MG tablet Take 1 tablet (10 mg total) by mouth 3 (three) times daily as needed for muscle spasms. 07/17/16   Sherwood Gambler, MD  methocarbamol (ROBAXIN) 500 MG tablet Take 1 tablet (500 mg total) by mouth 2 (two) times daily. 03/18/17   Lexx Monte, Jarrett Soho, PA-C  metroNIDAZOLE (FLAGYL) 500 MG tablet 1 tab by mouth with food twice daily for 7 days. 10/01/15   Mack Hook, MD  naproxen (NAPROSYN) 500 MG tablet Take 1 tablet (500 mg total) by mouth 2 (two) times daily with a meal. 03/18/17   Lenford Beddow, Jarrett Soho, PA-C    Family History Family History  Problem Relation Age of Onset  . Hypertension Mother   . Diabetes Sister   . Cancer Brother 55       throat cancer   . Cancer Father     Social History Social History  Substance Use Topics  . Smoking status: Former Smoker    Types: Cigarettes    Start date: 03/08/1980    Quit date: 03/08/2013  . Smokeless tobacco: Never Used  . Alcohol use No     Comment: History of abuse.  Stopped in 2011     Allergies   Other   Review of Systems Review of Systems  Constitutional:  Negative for fatigue and fever.  HENT: Negative for congestion.   Respiratory: Negative for cough, chest tightness and shortness of breath.   Cardiovascular: Positive for chest pain. Negative for leg swelling.  Gastrointestinal: Negative for abdominal pain and nausea.  Endocrine: Negative for polydipsia, polyphagia and polyuria.  Musculoskeletal: Positive for arthralgias (left shoulder).  Skin: Negative for rash.  Neurological: Negative for weakness, numbness and headaches.  Hematological: Negative for adenopathy.  All other systems reviewed and are negative.    Physical Exam Updated Vital Signs BP 106/75   Pulse 65   Temp 97.8 F (36.6 C) (Oral)   Resp 16   Ht 5\' 2"  (1.575 m)   Wt 242 lb (109.8 kg)   SpO2 96%   BMI 44.26 kg/m   Physical Exam   Constitutional: She appears well-developed and well-nourished. No distress.  Awake, alert, nontoxic appearance  HENT:  Head: Normocephalic and atraumatic.  Mouth/Throat: Oropharynx is clear and moist. No oropharyngeal exudate.  Eyes: Conjunctivae are normal. No scleral icterus.  Neck: Normal range of motion. Neck supple. No spinous process tenderness and no muscular tenderness present.  No midline or paraspinal tenderness  Cardiovascular: Normal rate, regular rhythm and intact distal pulses.   Pulmonary/Chest: Effort normal and breath sounds normal. No respiratory distress. She has no wheezes. She exhibits tenderness.    Equal chest expansion Tenderness to palpation of the left upper chest without palpable deformity, crepitus or flail segment. No rash to the skin. No warmth, erythema or induration.  Abdominal: Soft. Bowel sounds are normal. She exhibits no mass. There is no tenderness. There is no rebound and no guarding.  Musculoskeletal: Normal range of motion. She exhibits no edema.       Left shoulder: She exhibits tenderness and pain. She exhibits normal range of motion, no swelling, no effusion, no crepitus, no deformity, normal pulse and normal strength.  Full range of motion of the left shoulder without significant pain. Tenderness to palpation along the left upper chest ribs 1 through 3, joint line of the left shoulder and left trapezius.  Lymphadenopathy:    She has no cervical adenopathy.  Neurological: She is alert.  Speech is clear and goal oriented Moves extremities without ataxia  Skin: Skin is warm and dry. She is not diaphoretic.  Psychiatric: She has a normal mood and affect.  Nursing note and vitals reviewed.    ED Treatments / Results  Labs (all labs ordered are listed, but only abnormal results are displayed) Labs Reviewed  BASIC METABOLIC PANEL - Abnormal; Notable for the following:       Result Value   Potassium 3.4 (*)    Glucose, Bld 112 (*)    All  other components within normal limits  CBC - Abnormal; Notable for the following:    RBC 3.85 (*)    Hemoglobin 11.5 (*)    HCT 35.7 (*)    All other components within normal limits  I-STAT TROPOININ, ED    ED ECG REPORT   Date: 03/18/2017  Rate: 62  Rhythm: normal sinus rhythm  QRS Axis: normal  Intervals: PR prolonged  ST/T Wave abnormalities: normal  Conduction Disutrbances:first-degree A-V block   Narrative Interpretation: First-degree AV block, new from 2015  Old EKG Reviewed: changes noted from 05/11/14  I have personally reviewed the EKG tracing and agree with the computerized printout as noted.   Radiology Dg Chest 2 View  Result Date: 03/18/2017 CLINICAL DATA:  Left-sided chest tightness.  Left arm numbness. EXAM:  CHEST  2 VIEW COMPARISON:  One-view chest x-ray 05/11/2014 FINDINGS: The heart size is normal. Lungs are clear. There is no edema or effusion. No focal airspace disease is present. The visualized soft tissues and bony thorax are unremarkable. IMPRESSION: Negative two view chest x-ray Electronically Signed   By: San Morelle M.D.   On: 03/18/2017 19:43    Procedures Procedures (including critical care time)  Medications Ordered in ED Medications  potassium chloride SA (K-DUR,KLOR-CON) CR tablet 40 mEq (40 mEq Oral Given 03/18/17 2207)  ketorolac (TORADOL) injection 60 mg (60 mg Intramuscular Given 03/18/17 2207)     Initial Impression / Assessment and Plan / ED Course  I have reviewed the triage vital signs and the nursing notes.  Pertinent labs & imaging results that were available during my care of the patient were reviewed by me and considered in my medical decision making (see chart for details).     Patient presents with left sided chest pain and left shoulder pain 6 weeks. Troponin is negative and labs are reassuring. EKG is without acute ischemia. Clinically patient's history and physical exam are not consistent with acute coronary  syndrome, aortic dissection, pneumothorax or pneumonia. Chest x-ray is without acute abnormalities. Patient's pain is reproducible. She is comfortable in the room, laughing and frequently requesting to go home. Patient discussed with Dr. Laverta Baltimore who agrees low likelihood of cardiac involvement due to presentation. Patient will be discharged home with close primary care follow-up. She reports she will be able to schedule an appointment with her primary care on Monday. Patient given anti-inflammatories and muscle relaxers. Gentle stretching recommended. Patient instructed to return to emergency department for pain or shortness of breath on exertion, leg swelling, diaphoresis, nausea or any other concerning symptoms. She states understanding and is in agreement with the plan.  Final Clinical Impressions(s) / ED Diagnoses   Final diagnoses:  Intercostal pain  Acute pain of left shoulder    New Prescriptions Discharge Medication List as of 03/18/2017 10:04 PM    START taking these medications   Details  methocarbamol (ROBAXIN) 500 MG tablet Take 1 tablet (500 mg total) by mouth 2 (two) times daily., Starting Fri 03/18/2017, Print         Neomi Laidler, [place], PA-C 03/18/17 2357    Long, Wonda Olds, MD 03/19/17 [phone]

## 2017-03-31 ENCOUNTER — Telehealth: Payer: Self-pay | Admitting: Internal Medicine

## 2017-04-01 ENCOUNTER — Encounter: Payer: Self-pay | Admitting: Internal Medicine

## 2017-04-01 ENCOUNTER — Ambulatory Visit (INDEPENDENT_AMBULATORY_CARE_PROVIDER_SITE_OTHER): Payer: Self-pay | Admitting: Internal Medicine

## 2017-04-01 VITALS — BP 130/80 | HR 86 | Resp 14 | Ht 63.0 in | Wt 242.0 lb

## 2017-04-01 DIAGNOSIS — N760 Acute vaginitis: Secondary | ICD-10-CM

## 2017-04-01 DIAGNOSIS — B9689 Other specified bacterial agents as the cause of diseases classified elsewhere: Secondary | ICD-10-CM

## 2017-04-01 DIAGNOSIS — Z202 Contact with and (suspected) exposure to infections with a predominantly sexual mode of transmission: Secondary | ICD-10-CM

## 2017-04-01 LAB — POCT WET PREP WITH KOH
KOH PREP POC: NEGATIVE
RBC Wet Prep HPF POC: NEGATIVE
TRICHOMONAS UA: NEGATIVE
Yeast Wet Prep HPF POC: NEGATIVE

## 2017-04-01 MED ORDER — METRONIDAZOLE 500 MG PO TABS: ORAL_TABLET | ORAL | 0 refills | Status: DC

## 2017-04-01 NOTE — Telephone Encounter (Signed)
Spoke with patient coming for appointment today.

## 2017-04-01 NOTE — Progress Notes (Signed)
   Subjective:    Patient ID: Sarah Singh, female    DOB: 01-19-1966, 51 y.o.   MRN: [id number]  HPI   Was called by a guy she is involved with who told her he has trichomonas. He apparently was having some itching.  He admits to being sexually active with someone else.   She is not having discharge.  She did note itching a while back.  No odor.  No dysuria.  No definite urinary frequency.  No outpatient prescriptions have been marked as taking for the 04/01/17 encounter (Office Visit) with Mack Hook, MD.   No Active Allergies     Review of Systems     Objective:   Physical Exam NAD LUngs:  CTA CV:  RRR without murmur or rub, radial pulses normal and equal Abd:  Obese, + BS, no HSM or mass, NT GU:  Scant clear to white cervical discharge.  No inflammation of mucosa.  No cervical lesions.  No odor.  No CMT.  No Uterine or adnexal mass or tenderness.     Assessment & Plan:  1.  Possible exposure to STD, specifically Trichomonas.  No findings on wet prep.   Will go ahead and treat for trichomonas all the same in case of early infection not able to pick up. HIV, RPR, GC/Chlamydia as well today.

## 2017-04-02 LAB — HIV ANTIBODY (ROUTINE TESTING W REFLEX): HIV Screen 4th Generation wRfx: NONREACTIVE

## 2017-04-02 LAB — RPR QUALITATIVE: RPR Ser Ql: NONREACTIVE

## 2017-04-03 LAB — GC/CHLAMYDIA PROBE AMP
CHLAMYDIA, DNA PROBE: NEGATIVE
Neisseria gonorrhoeae by PCR: NEGATIVE

## 2018-03-14 ENCOUNTER — Emergency Department (HOSPITAL_COMMUNITY)
Admission: EM | Admit: 2018-03-14 | Discharge: 2018-03-14 | Disposition: A | Discharge status: Home / Self Care | Payer: BLUE CROSS/BLUE SHIELD | Attending: Emergency Medicine | Admitting: Emergency Medicine

## 2018-03-14 ENCOUNTER — Emergency Department (HOSPITAL_COMMUNITY): Payer: BLUE CROSS/BLUE SHIELD

## 2018-03-14 ENCOUNTER — Encounter (HOSPITAL_COMMUNITY): Payer: Self-pay | Admitting: Emergency Medicine

## 2018-03-14 DIAGNOSIS — Z87891 Personal history of nicotine dependence: Secondary | ICD-10-CM | POA: Diagnosis not present

## 2018-03-14 DIAGNOSIS — R030 Elevated blood-pressure reading, without diagnosis of hypertension: Secondary | ICD-10-CM | POA: Diagnosis not present

## 2018-03-14 DIAGNOSIS — R0789 Other chest pain: Secondary | ICD-10-CM | POA: Diagnosis not present

## 2018-03-14 DIAGNOSIS — R51 Headache: Secondary | ICD-10-CM | POA: Insufficient documentation

## 2018-03-14 DIAGNOSIS — Z79899 Other long term (current) drug therapy: Secondary | ICD-10-CM | POA: Insufficient documentation

## 2018-03-14 DIAGNOSIS — R079 Chest pain, unspecified: Secondary | ICD-10-CM | POA: Diagnosis present

## 2018-03-14 LAB — CBC
HEMATOCRIT: 37.8 % (ref 36.0–46.0)
HEMOGLOBIN: 12.3 g/dL (ref 12.0–15.0)
MCH: 29.5 pg (ref 26.0–34.0)
MCHC: 32.5 g/dL (ref 30.0–36.0)
MCV: 90.6 fL (ref 78.0–100.0)
Platelets: 323 10*3/uL (ref 150–400)
RBC: 4.17 MIL/uL (ref 3.87–5.11)
RDW: 13.3 % (ref 11.5–15.5)
WBC: 8.7 10*3/uL (ref 4.0–10.5)

## 2018-03-14 LAB — I-STAT BETA HCG BLOOD, ED (MC, WL, AP ONLY): I-stat hCG, quantitative: <5 m[IU]/mL (ref <5)

## 2018-03-14 LAB — BASIC METABOLIC PANEL
ANION GAP: 7 (ref 5–15)
BUN: 17 mg/dL (ref 6–20)
CO2: 25 mmol/L (ref 22–32)
Calcium: 9.2 mg/dL (ref 8.9–10.3)
Chloride: 109 mmol/L (ref 101–111)
Creatinine, Ser: 0.68 mg/dL (ref 0.44–1.00)
GFR calc Af Amer: >60 mL/min (ref >60)
Glucose, Bld: 91 mg/dL (ref 65–99)
POTASSIUM: 3.5 mmol/L (ref 3.5–5.1)
SODIUM: 141 mmol/L (ref 135–145)

## 2018-03-14 LAB — I-STAT TROPONIN, ED
TROPONIN I, POC: 0 ng/mL (ref 0.00–0.08)
Troponin i, poc: 0.04 ng/mL (ref 0.00–0.08)

## 2018-03-14 MED ORDER — ACETAMINOPHEN 325 MG PO TABS
650.0000 mg | ORAL_TABLET | Freq: Once | ORAL | Status: AC
Start: 2018-03-14 — End: 2018-03-14
  Administered 2018-03-14: 650 mg via ORAL
  Filled 2018-03-14: qty 2

## 2018-03-14 MED ORDER — HYDROCHLOROTHIAZIDE 25 MG PO TABS: 25.0000 mg | ORAL_TABLET | Freq: Every day | ORAL | 0 refills | Status: DC

## 2018-03-14 MED ORDER — HYDROCHLOROTHIAZIDE 25 MG PO TABS
25.0000 mg | ORAL_TABLET | Freq: Every day | ORAL | Status: DC
  Administered 2018-03-14: 25 mg via ORAL
  Filled 2018-03-14: qty 1

## 2018-03-14 NOTE — ED Provider Notes (Addendum)
[facility] EMERGENCY DEPARTMENT Provider Note   CSN: [id number] Arrival date & time: 03/14/18  1352     History   Chief Complaint Chief Complaint  Patient presents with  . Chest Pain    HPI Sarah Singh is a 52 y.o. female.  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  Chest pain is worse with exhalation.  She denies any shortness of breath denies nausea or sweatiness.  Pain is nonradiating.  She treated herself with Aleve she feels much improved and pain is mild at present..  She is been noncompliant with blood pressure medication for "a long time."  Meaning at least several months. Past Medical History:  Diagnosis Date  . 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  Patient Active Problem List   Diagnosis Date Noted  . Wax in ear 12/18/2014  . Hot flashes 12/18/2014  . Prediabetes 12/18/2014  Hypertension hypercholesterolemia  Past Surgical History:  Procedure Laterality Date  .  TM tubes     . TUBAL LIGATION  1992     OB History    Gravida  2   Para  2   Term      Preterm      AB      Living  2     SAB      TAB      Ectopic      Multiple      Live Births               Home Medications    Prior to Admission medications   Medication Sig Start Date End Date Taking? Authorizing Provider  Multiple Vitamins-Minerals (MULTIVITAMIN ADULTS 50+ PO) Take 1 tablet by mouth daily.   Yes [provider]  metroNIDAZOLE (FLAGYL) 500 MG tablet 4 tabs by mouth for 1 dose.  Take with fatty meal Patient not taking: Reported on 03/14/2018 04/01/17   Mack Hook, MD    Family History Family History  Problem Relation Age of Onset  . Hypertension Mother   . Diabetes Sister   . Cancer Brother 55       throat cancer   . Cancer  Father   She thinks father had MI in his 76s  Social History Social History   Tobacco Use  . Smoking status: Former Smoker    Types: Cigarettes    Start date: 03/08/1980    Last attempt to quit: 03/08/2013    Years since quitting: 5.0  . Smokeless tobacco: Never Used  Substance Use Topics  . Alcohol use: No    Alcohol/week: 0.0 oz    Comment: History of abuse.  Stopped in 2011  . Drug use: No    Types: Cocaine    Comment: no use since 2010.      Allergies   Other   Review of Systems Review of Systems  Constitutional: Negative.   HENT: Negative.   Respiratory: Negative.   Cardiovascular: Positive for chest pain.  Gastrointestinal: Negative.   Musculoskeletal: Negative.   Skin: Negative.   Neurological: Positive for headaches.  Psychiatric/Behavioral: Negative.   All other systems reviewed and are negative.    Physical Exam Updated Vital Signs BP (!) 183/98 (BP Location: Right Arm)   Pulse 67   Temp 97.6 F (36.4 C) (Oral)   Resp 16  Ht 5\' 2"  (1.575 m)   Wt 109.8 kg (242 lb)   SpO2 98%   BMI 44.26 kg/m   Physical Exam  Constitutional: She is oriented to person, place, and time. She appears well-developed and well-nourished. No distress.  Laughing, joking.  No distress  HENT:  Head: Normocephalic and atraumatic.  Eyes: Pupils are equal, round, and reactive to light. Conjunctivae are normal.  Neck: Neck supple. No tracheal deviation present. No thyromegaly present.  Cardiovascular: Normal rate and regular rhythm.  No murmur heard. Pulmonary/Chest: Effort normal and breath sounds normal. She exhibits tenderness.  Chest exquisitely tender at right parasternal area upon, reproducing pain exactly  Abdominal: Soft. Bowel sounds are normal. She exhibits no distension. There is no tenderness.  Obese  Musculoskeletal: Normal range of motion. She exhibits no edema or tenderness.  Neurological: She is alert and oriented to person, place, and time. Coordination  normal.  Gait normal Romberg normal pronator drift normal cranial nerves II through XII grossly intact  Skin: Skin is warm and dry. No rash noted.  Psychiatric: She has a normal mood and affect.  Nursing note and vitals reviewed.    ED Treatments / Results  Labs (all labs ordered are listed, but only abnormal results are displayed) Labs Reviewed  BASIC METABOLIC PANEL  CBC  I-STAT TROPONIN, ED  I-STAT BETA HCG BLOOD, ED ([facility], [facility], [facility] ONLY)  I-STAT TROPONIN, ED    EKG EKG Interpretation  Date/Time:  Tuesday Mar 14 2018 13:58:16 EDT Ventricular Rate:  68 PR Interval:  224 QRS Duration: 88 QT Interval:  410 QTC Calculation: 435 R Axis:   5 Text Interpretation:  Sinus rhythm with sinus arrhythmia with 1st degree A-V block Otherwise normal ECG No significant change since last tracing Confirmed by Orlie Dakin [phone]) on 03/14/2018 6:05:52 PM   Radiology Dg Chest 2 View  Result Date: 03/14/2018 CLINICAL DATA:  Acute onset right chest pain today. EXAM: CHEST - 2 VIEW COMPARISON:  PA and lateral chest 03/18/2017 and 01/13/2014. FINDINGS: The lungs are clear. Heart size is normal. No pneumothorax or pleural effusion. No acute bony abnormality. IMPRESSION: No acute disease. Electronically Signed   By: Inge Rise M.D.   On: 03/14/2018 14:49  chestest x-ray viewed by me  Procedures Procedures (including critical care time)  Medications Ordered in ED Medications  hydrochlorothiazide (HYDRODIURIL) tablet 25 mg (has no administration in time range)  acetaminophen (TYLENOL) tablet 650 mg (has no administration in time range)   Results for orders placed or performed during the hospital encounter of 22/29/79  Basic metabolic panel  Result Value Ref Range   Sodium 141 135 - 145 mmol/L   Potassium 3.5 3.5 - 5.1 mmol/L   Chloride 109 101 - 111 mmol/L   CO2 25 22 - 32 mmol/L   Glucose, Bld 91 65 - 99 mg/dL   BUN 17 6 - 20 mg/dL   Creatinine, Ser 0.68 0.44 - 1.00 mg/dL    Calcium 9.2 8.9 - 10.3 mg/dL   GFR calc non Af Amer >60 >60 mL/min   GFR calc Af Amer >60 >60 mL/min   Anion gap 7 5 - 15  CBC  Result Value Ref Range   WBC 8.7 4.0 - 10.5 K/uL   RBC 4.17 3.87 - 5.11 MIL/uL   Hemoglobin 12.3 12.0 - 15.0 g/dL   HCT 37.8 36.0 - 46.0 %   MCV 90.6 78.0 - 100.0 fL   MCH 29.5 26.0 - 34.0 pg   MCHC 32.5  30.0 - 36.0 g/dL   RDW 13.3 11.5 - 15.5 %   Platelets 323 150 - 400 K/uL  I-stat troponin, ED  Result Value Ref Range   Troponin i, poc 0.00 0.00 - 0.08 ng/mL   Comment 3          I-Stat beta hCG blood, ED  Result Value Ref Range   I-stat hCG, quantitative <5.0 <5 mIU/mL   Comment 3          I-Stat Troponin, ED (not at [facility])  Result Value Ref Range   Troponin i, poc 0.04 0.00 - 0.08 ng/mL   Comment 3           Dg Chest 2 View  Result Date: 03/14/2018 CLINICAL DATA:  Acute onset right chest pain today. EXAM: CHEST - 2 VIEW COMPARISON:  PA and lateral chest 03/18/2017 and 01/13/2014. FINDINGS: The lungs are clear. Heart size is normal. No pneumothorax or pleural effusion. No acute bony abnormality. IMPRESSION: No acute disease. Electronically Signed   By: Inge Rise M.D.   On: 03/14/2018 14:49     Initial Impression / Assessment and Plan / ED Course  I have reviewed the triage vital signs and the nursing notes.  Pertinent labs & imaging results that were available during my care of the patient were reviewed by me and considered in my medical decision making (see chart for details).     7:30 PM patient asymptomatic after treatment with Tylenol.  History exam is consistent with chest wall pain.  Heart score equals 2-3.  Plan follow-up with PMD.  Prescription HCTZ Tylenol for pain Final Clinical Impressions(s) / ED Diagnoses  Diagnosis #1 chest wall pain #2 headache #3 elevated blood pressure #4 medication noncompliance Final diagnoses:  None    ED Discharge Orders    None    Note.  Patient reports that she has a PCP she goes to a  clinic in [place] however she does not recall the name she has it written down at home   Orlie Dakin, MD 03/14/18 [id number]    Orlie Dakin, MD 03/14/18 [id number]

## 2018-03-14 NOTE — ED Triage Notes (Signed)
Per EMS- pt had sudden onset right sided chest pain while working. Pt also has a headache. BP 170s. Pt endorses recent stress. No meds given.

## 2018-03-14 NOTE — Discharge Instructions (Addendum)
Take Tylenol as needed for pain.  Take your medication for blood pressure starting tomorrow.  Call your primary care physician tomorrow to schedule an appointment for within 1 week to get your blood pressure rechecked or if you can get into see her primary care physician go to an urgent care center to get your blood pressure rechecked or call the number on these instructions to get a new primary care physician

## 2018-03-28 ENCOUNTER — Other Ambulatory Visit: Payer: Self-pay

## 2018-03-28 MED ORDER — HYDROCHLOROTHIAZIDE 25 MG PO TABS: 25.0000 mg | ORAL_TABLET | Freq: Every day | ORAL | 0 refills | Status: DC

## 2018-04-03 ENCOUNTER — Encounter: Payer: Self-pay | Admitting: Internal Medicine

## 2018-04-03 ENCOUNTER — Ambulatory Visit: Payer: Self-pay | Admitting: Internal Medicine

## 2018-04-03 VITALS — BP 122/80 | HR 84 | Resp 12 | Ht 63.0 in | Wt 239.0 lb

## 2018-04-03 DIAGNOSIS — Z6841 Body Mass Index (BMI) 40.0 and over, adult: Secondary | ICD-10-CM

## 2018-04-03 DIAGNOSIS — I10 Essential (primary) hypertension: Secondary | ICD-10-CM

## 2018-04-03 DIAGNOSIS — Z79899 Other long term (current) drug therapy: Secondary | ICD-10-CM

## 2018-04-03 MED ORDER — HYDROCHLOROTHIAZIDE 25 MG PO TABS: 25.0000 mg | ORAL_TABLET | Freq: Every day | ORAL | 11 refills | Status: DC

## 2018-04-03 NOTE — Progress Notes (Signed)
   Subjective:    Patient ID: Sarah Singh, female    DOB: [birth year], 52 y.o.   MRN: [id number]  HPI   1.  Essential Hypertension:  bp elevated in ED visit 03/14/18 at 183/98.  Drl Jacubowitz initiated patient on HCTZ 25 mg daily.  She is not having any problems with the medication.    Diet: Breakfast:  Oodles of noodle or fried chicken, sometimes a burger--fast food.   Water  She doesn't really have meals, she grazes all day long. May eat another oodles of noodles within an hour of the first serving.  Fried chicken midday.  May eat fried chicken again later in the day.  Occasional green beans  Is not physically active at all.  Does not like to go outside.  Current Meds  Medication Sig  . hydrochlorothiazide (HYDRODIURIL) 25 MG tablet Take 1 tablet (25 mg total) by mouth daily.  . Multiple Vitamins-Minerals (MULTIVITAMIN ADULTS 50+ PO) Take 1 tablet by mouth daily.  . [DISCONTINUED] hydrochlorothiazide (HYDRODIURIL) 25 MG tablet Take 1 tablet (25 mg total) by mouth daily.    Allergies  Allergen Reactions  . Other Other (See Comments)    NO NARCOTICS      Review of Systems     Objective:   Physical Exam Morbidly obese NAD Neck:  Supple, No adenopathy, no thyromegaly Chest:  CTA CV:  RRR with normal S1 and S2, No S3, S4 or murmur.  Radial and DP pulses normal and equal LE:  No edema.     Assessment & Plan:  1.  HM:  No immunizations due to loss of power--will get Tdap to her in follow up 3 months.  2.  Essential Hypertension:  Encouraged lifestyle changes with diet and physical activity and for weight loss as a way to control bp as well. To make small goals--like on a weekly basis. To get [facility] card HCTZ 25 mg refilled for 1 year. BMP today  3.  Morbid obesity:  As in #2.  TSH, FLP today.  History of prediabetes, but blood glucose was fine in ED recently.

## 2018-04-03 NOTE — Patient Instructions (Addendum)
Drink a glass of water before every meal Drink 6-8 glasses of water daily Eat three meals daily Eat a protein and healthy fat with every meal (eggs,fish, chicken, [place] and limit red meats) Eat 5 servings of vegetables daily, mix the colors Eat 2 servings of fruit daily with skin, if skin is edible Use smaller plates Put food/utensils down as you chew and swallow each bite Eat at a table with friends/family at least once daily, no TV Do not eat in front of the TV  Recent studies show that people who consume all of their calories in a 12 hour period lose weight more efficiently.  For example, if you eat your first meal at 7:00 a.m., your last meal of the day should be completed by 7:00 p.m.  Work on something fun that is physically active to gradually increase doing 30 minutes daily.  Once you are at 30 minutes, gradually increase to 60 minutes. Take the stairs or walk places instead of using elevator/escalator and driving.

## 2018-04-04 LAB — TSH: TSH: 2.05 u[IU]/mL (ref 0.450–4.500)

## 2018-04-04 LAB — LIPID PANEL W/O CHOL/HDL RATIO
Cholesterol, Total: 204 mg/dL — ABNORMAL HIGH (ref 100–199)
HDL: 51 mg/dL (ref >39)
LDL CALC: 133 mg/dL — AB (ref 0–99)
TRIGLYCERIDES: 99 mg/dL (ref 0–149)
VLDL Cholesterol Cal: 20 mg/dL (ref 5–40)

## 2018-04-04 LAB — BASIC METABOLIC PANEL
BUN/Creatinine Ratio: 21 (ref 9–23)
BUN: 14 mg/dL (ref 6–24)
CALCIUM: 9.8 mg/dL (ref 8.7–10.2)
CO2: 22 mmol/L (ref 20–29)
CREATININE: 0.67 mg/dL (ref 0.57–1.00)
Chloride: 103 mmol/L (ref 96–106)
GFR calc Af Amer: 118 mL/min/{1.73_m2} (ref >59)
GFR, EST NON AFRICAN AMERICAN: 102 mL/min/{1.73_m2} (ref >59)
Glucose: 89 mg/dL (ref 65–99)
Potassium: 4.1 mmol/L (ref 3.5–5.2)
Sodium: 143 mmol/L (ref 134–144)

## 2018-05-23 ENCOUNTER — Ambulatory Visit: Payer: Self-pay | Admitting: Internal Medicine

## 2018-05-23 ENCOUNTER — Encounter: Payer: Self-pay | Admitting: Internal Medicine

## 2018-05-23 VITALS — BP 122/82 | HR 72 | Resp 12 | Ht 63.0 in | Wt 242.0 lb

## 2018-05-23 DIAGNOSIS — K047 Periapical abscess without sinus: Secondary | ICD-10-CM

## 2018-05-23 MED ORDER — NAPROXEN 500 MG PO TABS: 500.0000 mg | ORAL_TABLET | Freq: Two times a day (BID) | ORAL | 1 refills | Status: DC

## 2018-05-23 MED ORDER — PENICILLIN V POTASSIUM 250 MG PO TABS: ORAL_TABLET | ORAL | 0 refills | Status: DC

## 2018-05-23 NOTE — Progress Notes (Signed)
   Subjective:    Patient ID: Sarah Singh, female    DOB: 09/04/1966, 52 y.o.   MRN: [id number]  HPI   1.  Pain of posterior molars for 5-6 months bilateral lower jaw.  No definite fevers associated.  No swelling or drainage from gingiva surrounding teeth. Aleve 4 tabs once daily maybe 2 days out of the week.    2.  Obesity with hypertension:  Walking daily.  Trying to eat more vegetables.  Unfortunately has gained a bit of weight.  Current Meds  Medication Sig  . hydrochlorothiazide (HYDRODIURIL) 25 MG tablet Take 1 tablet (25 mg total) by mouth daily.  . Multiple Vitamins-Minerals (MULTIVITAMIN ADULTS 50+ PO) Take 1 tablet by mouth daily.    Allergies  Allergen Reactions  . Other Other (See Comments)    NO NARCOTICS--recovering addict   Review of Systems     Objective:   Physical Exam NAD HEENT: PERRL, EOMI, TMs pearly gray, throat without injection.  No definitive cavities of teeth with pain, but quite tender to palpation of gingiva surrounding, particularly lingual side of gums.  No fluctuance or obvious erythema. Neck:  Supple, No adenopathy, no thyromegaly Chest:  CTA CV:  RRR without murmur or rub.  Radial pulses normal and equal       Assessment & Plan:  Likely abscessed teeth:  PCN 250 mg 4 times daily for 7 days.  Naproxen 500 mg twice daily with meals.   No Aleve or other NSAID with Naproxen. Urgent dental referral.

## 2018-07-04 ENCOUNTER — Ambulatory Visit: Payer: Self-pay | Admitting: Internal Medicine

## 2018-07-24 ENCOUNTER — Encounter: Payer: Self-pay | Admitting: Internal Medicine

## 2018-07-24 ENCOUNTER — Ambulatory Visit: Payer: Self-pay | Admitting: Internal Medicine

## 2018-07-24 VITALS — BP 124/70 | HR 72 | Resp 12 | Ht 63.0 in | Wt 240.0 lb

## 2018-07-24 DIAGNOSIS — Z6841 Body Mass Index (BMI) 40.0 and over, adult: Secondary | ICD-10-CM

## 2018-07-24 DIAGNOSIS — Z23 Encounter for immunization: Secondary | ICD-10-CM

## 2018-07-24 DIAGNOSIS — K047 Periapical abscess without sinus: Secondary | ICD-10-CM

## 2018-07-24 DIAGNOSIS — R7303 Prediabetes: Secondary | ICD-10-CM

## 2018-07-24 DIAGNOSIS — G5603 Carpal tunnel syndrome, bilateral upper limbs: Secondary | ICD-10-CM

## 2018-07-24 DIAGNOSIS — I10 Essential (primary) hypertension: Secondary | ICD-10-CM | POA: Diagnosis not present

## 2018-07-24 DIAGNOSIS — G5621 Lesion of ulnar nerve, right upper limb: Secondary | ICD-10-CM

## 2018-07-24 NOTE — Progress Notes (Signed)
   Subjective:    Patient ID: Sarah Singh, female    DOB: 03-13-1966, 52 y.o.   MRN: [id number]  HPI   1.  Caried painful tooth:  She did have the tooth pulled.  Went back to only have half of her teeth cleaned.  She is frustrated with repeat visits.  Would like to have all taken care of at one time.  2.  Obesity:  Feels she knows what to do with diet and physical activity.  Is more active with her second job--on her feet walking all day. Not interested in working with Nutrition.  3.  Numbness and tingling in hands and forearms.  Mainly occurs with sleep.  She is not sure if ulnar or radial side of hand goes numb  She can shake out her hand and improves.    Current Meds  Medication Sig  . hydrochlorothiazide (HYDRODIURIL) 25 MG tablet Take 1 tablet (25 mg total) by mouth daily.    Allergies  Allergen Reactions  . Other Other (See Comments)    NO NARCOTICS--recovering addict    Review of Systems     Objective:   Physical Exam NAD Bilateral arms:  + tinels and phalen with median nerve at wrist.  + tinels with right ulnar nerve and medial epitrochlear area. Normal sensation to light touch with all fingers.  No muscle wasting of hands.  Good grip.       Assessment & Plan:  1.  Obesity/prediabetes:  Will have her followup with labs at her CPE with pap in 3 months. A1C then. Encouraged making small goals to get 1 lb of weight loss weekly.  2. Dental pain: resolved with removal of caried tooth.  3.  Hypertension:  Controlled with hctz.  4.  Bilateral Carpal Tunnel Syndrome.  May also have ulnar peripheral neuropathy at right elbow:  Cock up splints.  Ibuprofen 600 mg twice daily for 2 weeks with food.  Elbow pads for elbows with sleep.

## 2018-07-24 NOTE — Patient Instructions (Addendum)
Drink a glass of water before every meal Drink 6-8 glasses of water daily Eat three meals daily Eat a protein and healthy fat with every meal (eggs,fish, chicken, [place] and limit red meats) Eat 5 servings of vegetables daily, mix the colors Eat 2 servings of fruit daily with skin, if skin is edible Use smaller plates Put food/utensils down as you chew and swallow each bite Eat at a table with friends/family at least once daily, no TV Do not eat in front of the TV  Recent studies show that people who consume all of their calories in a 12 hour period lose weight more efficiently.  For example, if you eat your first meal at 7:00 a.m., your last meal of the day should be completed by 7:00 p.m.  Cock up splints for both wrists--wear every night. Ibuprofen 200 mg take 3 tabs by mouth twice daily with meal for 2 weeks. Consider right elbow pad for sleep as well--find at used sports store--volleyball pads

## 2018-07-25 DIAGNOSIS — G5621 Lesion of ulnar nerve, right upper limb: Secondary | ICD-10-CM | POA: Insufficient documentation

## 2018-07-25 DIAGNOSIS — I10 Essential (primary) hypertension: Secondary | ICD-10-CM | POA: Insufficient documentation

## 2018-07-25 DIAGNOSIS — G5603 Carpal tunnel syndrome, bilateral upper limbs: Secondary | ICD-10-CM | POA: Insufficient documentation

## 2018-07-25 HISTORY — DX: Essential (primary) hypertension: I10

## 2018-10-23 ENCOUNTER — Encounter: Payer: Self-pay | Admitting: Internal Medicine

## 2018-10-27 ENCOUNTER — Ambulatory Visit: Payer: Self-pay | Admitting: Internal Medicine

## 2018-10-27 ENCOUNTER — Encounter: Payer: Self-pay | Admitting: Internal Medicine

## 2018-10-27 VITALS — BP 126/82 | HR 78 | Resp 12 | Ht 63.0 in | Wt 246.0 lb

## 2018-10-27 DIAGNOSIS — E782 Mixed hyperlipidemia: Secondary | ICD-10-CM

## 2018-10-27 DIAGNOSIS — R195 Other fecal abnormalities: Secondary | ICD-10-CM

## 2018-10-27 DIAGNOSIS — R350 Frequency of micturition: Secondary | ICD-10-CM

## 2018-10-27 DIAGNOSIS — Z79899 Other long term (current) drug therapy: Secondary | ICD-10-CM

## 2018-10-27 DIAGNOSIS — Z124 Encounter for screening for malignant neoplasm of cervix: Secondary | ICD-10-CM

## 2018-10-27 DIAGNOSIS — G5603 Carpal tunnel syndrome, bilateral upper limbs: Secondary | ICD-10-CM

## 2018-10-27 DIAGNOSIS — Z0000 Encounter for general adult medical examination without abnormal findings: Secondary | ICD-10-CM

## 2018-10-27 DIAGNOSIS — R7303 Prediabetes: Secondary | ICD-10-CM

## 2018-10-27 DIAGNOSIS — H547 Unspecified visual loss: Secondary | ICD-10-CM

## 2018-10-27 DIAGNOSIS — I10 Essential (primary) hypertension: Secondary | ICD-10-CM

## 2018-10-27 DIAGNOSIS — Z1239 Encounter for other screening for malignant neoplasm of breast: Secondary | ICD-10-CM

## 2018-10-27 DIAGNOSIS — Z23 Encounter for immunization: Secondary | ICD-10-CM

## 2018-10-27 LAB — POCT WET PREP WITH KOH
KOH Prep POC: NEGATIVE
RBC Wet Prep HPF POC: NEGATIVE
Trichomonas, UA: NEGATIVE
Yeast Wet Prep HPF POC: NEGATIVE

## 2018-10-27 LAB — POCT URINALYSIS DIPSTICK
Bilirubin, UA: NEGATIVE
Glucose, UA: NEGATIVE
KETONES UA: NEGATIVE
Nitrite, UA: NEGATIVE
Protein, UA: NEGATIVE
SPEC GRAV UA: 1.025 (ref 1.010–1.025)
Urobilinogen, UA: 0.2 E.U./dL
pH, UA: 5 (ref 5.0–8.0)

## 2018-10-27 NOTE — Progress Notes (Signed)
Subjective:    Patient ID: Sarah Singh, female    DOB: Oct 27, 1966, 52 y.o.   MRN: [id number]  HPI   CPE with pap  1.  Pap:  Cannot remember where or when last pap was done.  Never abnormal.  No family history of cervical cancer.  2.  Mammogram:  Last 2013  Normal.  No family history of breast cancer.  3.  Osteoprevention: Eats cheese and drinks milk here and there.  Would be willing to take calcium and vitamin D supplements.  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    4.  Guaiac Cards:  Never.    5.  Colonoscopy:  Never.  No family history of colon cancer.  6.  Immunizations:  Did not get flu vaccine this year. Immunization History  Administered Date(s) Administered  . Influenza Inj Mdck Quad Pf 10/27/2018  . Tdap 07/24/2018     7.  Glucose/Cholesterol:  Prediabetes with A1C of 6.1 % back in 2016.  Cholesterol is somewhat high as well.  Lipid Panel     Component Value Date/Time   CHOL 204 (H) 04/03/2018 1156   TRIG 99 04/03/2018 1156   HDL 51 04/03/2018 1156   LDLCALC 133 (H) 04/03/2018 1156      Review of Systems  Constitutional: Negative for appetite change (She continues to drink soda all day.  Describes diet high in fat with fried chicken.) and fatigue.  HENT: Positive for dental problem (multiple teeth that hurt.  She does not want to go back and forth to dentist.  Wants them to take care of all her problematic teeth at once, so does not want to go back to [place] clinic.). Negative for rhinorrhea, sinus pressure and sore throat.   Eyes: Positive for visual disturbance (decreased visual acuity.  Is supposed to have glasses, but does not have.  ).  Respiratory: Negative for cough and shortness of breath.   Cardiovascular: Negative for chest pain, palpitations and leg swelling.  Gastrointestinal: Negative for abdominal pain, blood in stool (No melena), constipation and diarrhea.  Genitourinary: Negative for dysuria, frequency and hematuria.    Neurological: Positive for numbness (still with numbness in hands.  WEars cock up splints at night.  Sounds like wrong splints.).  Psychiatric/Behavioral: Negative for dysphoric mood. The patient is not nervous/anxious.        Objective:   Physical Exam Vitals signs and nursing note reviewed.  Constitutional:      Appearance: She is obese.  HENT:     Head: Normocephalic and atraumatic.     Right Ear: Hearing, ear canal and external ear normal.     Left Ear: Hearing, ear canal and external ear normal.     Ears:     Comments: TMs pink and dull bilaterally.    Nose: Nose normal.     Mouth/Throat:     Mouth: Mucous membranes are moist.     Dentition: Dental caries (Many broken, caried teeth.) present.     Pharynx: Oropharynx is clear. Uvula midline.  Eyes:     Extraocular Movements: Extraocular movements intact.     Conjunctiva/sclera: Conjunctivae normal.     Pupils: Pupils are equal, round, and reactive to light.     Comments: Discs sharp bilaterally.  Neck:     Musculoskeletal: Full passive range of motion without pain, normal range of motion and neck supple.     Thyroid: No thyroid mass or thyromegaly.  Cardiovascular:  Rate and Rhythm: Normal rate and regular rhythm.     Heart sounds: S1 normal and S2 normal. No murmur. No friction rub. No S3 or S4 sounds.      Comments: No carotid bruits.  Carotid, radial, femoral, DP and PT pulses normal and equal.  Pulmonary:     Effort: Pulmonary effort is normal.     Breath sounds: Normal breath sounds.  Chest:     Breasts:        Right: No inverted nipple, mass, nipple discharge or skin change.        Left: No inverted nipple, mass, nipple discharge or skin change.  Abdominal:     General: Bowel sounds are normal.     Palpations: Abdomen is soft. There is no hepatomegaly, splenomegaly or mass.     Tenderness: There is no abdominal tenderness.     Hernia: No hernia is present. There is no hernia in the right inguinal area or  left inguinal area.  Genitourinary:    Rectum: Guaiac result positive. No mass.     Comments: Normal female external genitalia Cervix without lesion.  Scant mucousy vaginal fluid. No CMT. No uterine or adnexal mass or tenderness. Exam limited by size. Musculoskeletal: Normal range of motion.  Lymphadenopathy:     Head:     Right side of head: No submental or submandibular adenopathy.     Left side of head: No submental or submandibular adenopathy.     Cervical: No cervical adenopathy.     Upper Body:     Right upper body: No supraclavicular or axillary adenopathy.     Left upper body: No supraclavicular or axillary adenopathy.     Lower Body: No right inguinal adenopathy. No left inguinal adenopathy.  Skin:    General: Skin is warm.     Capillary Refill: Capillary refill takes less than 2 seconds.     Findings: No rash.  Neurological:     Mental Status: She is alert and oriented to person, place, and time.     Cranial Nerves: Cranial nerves are intact.     Sensory: Sensation is intact.     Motor: Motor function is intact.     Coordination: Coordination is intact.     Gait: Gait is intact.     Deep Tendon Reflexes: Reflexes are normal and symmetric.  Psychiatric:        Attention and Perception: Attention normal.        Mood and Affect: Mood normal.        Speech: Speech normal.        Behavior: Behavior normal. Behavior is cooperative.           Assessment & Plan:  1.  CPE with pap Mammogram Influenza vaccine. Fasting labs:  FLP, CBC, CMP, A1C, UA  2.  Dental Decay:  List of low cost dental clinics.  Does not want to go back to [place]  3.  Decreased Visual Acuity:  Optometry referral.  4.  Guaiac positive stool today on exam.  GI referral for Colonoscopy at least.  5.  Prediabetes:  A1C  6.  Hyperlipidemia:  FLP  7.  Carpal Tunnel Syndrome:  Showed her what cock up splints should look like.

## 2018-10-28 LAB — COMPREHENSIVE METABOLIC PANEL
ALBUMIN: 3.9 g/dL (ref 3.5–5.5)
ALK PHOS: 90 IU/L (ref 39–117)
ALT: 13 IU/L (ref 0–32)
AST: 16 IU/L (ref 0–40)
Albumin/Globulin Ratio: 1 — ABNORMAL LOW (ref 1.2–2.2)
BUN / CREAT RATIO: 24 — AB (ref 9–23)
BUN: 16 mg/dL (ref 6–24)
Bilirubin Total: 0.3 mg/dL (ref 0.0–1.2)
CHLORIDE: 101 mmol/L (ref 96–106)
CO2: 25 mmol/L (ref 20–29)
CREATININE: 0.68 mg/dL (ref 0.57–1.00)
Calcium: 9.7 mg/dL (ref 8.7–10.2)
GFR calc Af Amer: 117 mL/min/{1.73_m2} (ref >59)
GFR calc non Af Amer: 102 mL/min/{1.73_m2} (ref >59)
GLUCOSE: 76 mg/dL (ref 65–99)
Globulin, Total: 3.9 g/dL (ref 1.5–4.5)
Potassium: 3.9 mmol/L (ref 3.5–5.2)
Sodium: 143 mmol/L (ref 134–144)
Total Protein: 7.8 g/dL (ref 6.0–8.5)

## 2018-10-28 LAB — CBC WITH DIFFERENTIAL/PLATELET
BASOS ABS: 0.1 10*3/uL (ref 0.0–0.2)
Basos: 1 %
EOS (ABSOLUTE): 0.2 10*3/uL (ref 0.0–0.4)
Eos: 3 %
HEMOGLOBIN: 12.4 g/dL (ref 11.1–15.9)
Hematocrit: 37.9 % (ref 34.0–46.6)
IMMATURE GRANS (ABS): 0 10*3/uL (ref 0.0–0.1)
Immature Granulocytes: 0 %
LYMPHS: 43 %
Lymphocytes Absolute: 3.6 10*3/uL — ABNORMAL HIGH (ref 0.7–3.1)
MCH: 29.2 pg (ref 26.6–33.0)
MCHC: 32.7 g/dL (ref 31.5–35.7)
MCV: 89 fL (ref 79–97)
MONOCYTES: 7 %
Monocytes Absolute: 0.6 10*3/uL (ref 0.1–0.9)
Neutrophils Absolute: 3.9 10*3/uL (ref 1.4–7.0)
Neutrophils: 46 %
PLATELETS: 341 10*3/uL (ref 150–450)
RBC: 4.24 x10E6/uL (ref 3.77–5.28)
RDW: 13.3 % (ref 12.3–15.4)
WBC: 8.3 10*3/uL (ref 3.4–10.8)

## 2018-10-28 LAB — LIPID PANEL W/O CHOL/HDL RATIO
CHOLESTEROL TOTAL: 203 mg/dL — AB (ref 100–199)
HDL: 50 mg/dL (ref >39)
LDL Calculated: 135 mg/dL — ABNORMAL HIGH (ref 0–99)
TRIGLYCERIDES: 90 mg/dL (ref 0–149)
VLDL Cholesterol Cal: 18 mg/dL (ref 5–40)

## 2018-10-28 LAB — HGB A1C W/O EAG: HEMOGLOBIN A1C: 6.1 % — AB (ref 4.8–5.6)

## 2018-10-29 DIAGNOSIS — R195 Other fecal abnormalities: Secondary | ICD-10-CM | POA: Insufficient documentation

## 2018-10-29 DIAGNOSIS — H547 Unspecified visual loss: Secondary | ICD-10-CM | POA: Insufficient documentation

## 2018-10-29 LAB — URINE CULTURE

## 2018-10-31 LAB — CYTOLOGY - PAP

## 2018-11-02 MED ORDER — FLUCONAZOLE 150 MG PO TABS: ORAL_TABLET | ORAL | 0 refills | Status: DC

## 2018-11-02 NOTE — Addendum Note (Signed)
Addended by: Marcelino Duster on: 11/02/2018 10:46 AM   Modules accepted: Orders

## 2018-12-02 ENCOUNTER — Encounter: Payer: Self-pay | Admitting: Internal Medicine

## 2019-01-04 ENCOUNTER — Emergency Department (HOSPITAL_COMMUNITY)
Admission: EM | Admit: 2019-01-04 | Discharge: 2019-01-04 | Disposition: A | Discharge status: Home / Self Care | Payer: BLUE CROSS/BLUE SHIELD | Attending: Emergency Medicine | Admitting: Emergency Medicine

## 2019-01-04 ENCOUNTER — Emergency Department (HOSPITAL_COMMUNITY): Payer: BLUE CROSS/BLUE SHIELD

## 2019-01-04 ENCOUNTER — Other Ambulatory Visit: Payer: Self-pay

## 2019-01-04 ENCOUNTER — Encounter (HOSPITAL_COMMUNITY): Payer: Self-pay | Admitting: *Deleted

## 2019-01-04 DIAGNOSIS — M11261 Other chondrocalcinosis, right knee: Secondary | ICD-10-CM | POA: Insufficient documentation

## 2019-01-04 DIAGNOSIS — M25561 Pain in right knee: Secondary | ICD-10-CM | POA: Insufficient documentation

## 2019-01-04 DIAGNOSIS — Z87891 Personal history of nicotine dependence: Secondary | ICD-10-CM | POA: Insufficient documentation

## 2019-01-04 DIAGNOSIS — Z79899 Other long term (current) drug therapy: Secondary | ICD-10-CM | POA: Insufficient documentation

## 2019-01-04 DIAGNOSIS — I10 Essential (primary) hypertension: Secondary | ICD-10-CM | POA: Diagnosis not present

## 2019-01-04 DIAGNOSIS — M1120 Other chondrocalcinosis, unspecified site: Secondary | ICD-10-CM

## 2019-01-04 MED ORDER — INDOMETHACIN 25 MG PO CAPS: 25.0000 mg | ORAL_CAPSULE | Freq: Three times a day (TID) | ORAL | 0 refills | Status: AC | PRN

## 2019-01-04 MED ORDER — IBUPROFEN 800 MG PO TABS
800.0000 mg | ORAL_TABLET | Freq: Once | ORAL | Status: AC
  Administered 2019-01-04: 800 mg via ORAL
  Filled 2019-01-04: qty 1

## 2019-01-04 MED ORDER — INDOMETHACIN 25 MG PO CAPS: 25.0000 mg | ORAL_CAPSULE | Freq: Three times a day (TID) | ORAL | 0 refills | Status: DC | PRN

## 2019-01-04 NOTE — ED Triage Notes (Signed)
Pt reports waking up with Rt knee pain . Pt denies injury .

## 2019-01-04 NOTE — ED Notes (Signed)
Declined W/C at D/C and was escorted to lobby by RN. 

## 2019-01-04 NOTE — ED Provider Notes (Signed)
[place] EMERGENCY DEPARTMENT Provider Note   CSN: [id number] Arrival date & time: 01/04/19  0902    History   Chief Complaint Chief Complaint  Patient presents with  . Knee Pain    HPI Sarah Singh is a 53 y.o. female.     Pt presents to the ED complaining of gradual onset, constant, achy/sharp, 9/10 right knee pain that began last night prior to going to sleep. She reports worsening pain after waking up this morning. No known injury to the knee. No change in activity yesterday from baseline. Pt has never had pain like this before. She has taken Naproxen without relief. Denies fever, chills, weakness, numbness, redness, swelling, or any other associated symptoms.   The history is provided by the patient.  Knee Pain  Associated symptoms: no fatigue     Past Medical History:  Diagnosis Date  . Prediabetes 12/18/2014  . Vitamin D deficiency 2016    Patient Active Problem List   Diagnosis Date Noted  . Decreased visual acuity 10/29/2018  . Guaiac + stool 10/29/2018  . 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 Essential hypertension 07/25/2018  . Bilateral carpal tunnel syndrome 07/25/2018  . Wax in ear 12/18/2014  . Hot flashes 12/18/2014  . Prediabetes 12/18/2014    Past Surgical History:  Procedure Laterality Date  .  TM tubes     . TUBAL LIGATION  1992     OB History    Gravida  2   Para  2   Term      Preterm      AB      Living  2     SAB      TAB      Ectopic      Multiple      Live Births               Home Medications    Prior to Admission medications   Medication Sig Start Date End Date Taking? Authorizing Provider  fluconazole (DIFLUCAN) 150 MG tablet 1 tab by mouth for one dose. 11/02/18   Mack Hook, MD  hydrochlorothiazide (HYDRODIURIL) 25 MG tablet Take 1 tablet (25 mg total) by mouth daily. 04/03/18   Mack Hook, MD  indomethacin (INDOCIN) 25 MG capsule Take 1  capsule (25 mg total) by mouth 3 (three) times daily as needed for up to 10 days. 01/04/19 01/14/19  Eustaquio Maize, PA-C  Multiple Vitamins-Minerals (MULTIVITAMIN ADULTS 50+ PO) Take 1 tablet by mouth daily.    [provider]    Family History Family History  Problem Relation Age of Onset  . Hypertension Mother   . Diabetes Sister   . Cancer Brother 55       throat cancer   . Cancer Father     Social History Social History   Tobacco Use  . Smoking status: Former Smoker    Types: Cigarettes    Start date: 03/08/1980    Last attempt to quit: 03/08/2013    Years since quitting: 5.8  . Smokeless tobacco: Never Used  Substance Use Topics  . Alcohol use: No    Alcohol/week: 0.0 standard drinks    Comment: History of abuse.  Stopped in 2011  . Drug use: No    Types: Cocaine    Comment: no use since 2010.      Allergies   Other   Review of Systems Review of Systems  Constitutional: Negative for chills  and fatigue.  Respiratory: Negative for shortness of breath.   Cardiovascular: Negative for leg swelling.  Musculoskeletal: Positive for arthralgias (right knee). Negative for gait problem and joint swelling.  Skin: Negative for wound.     Physical Exam Updated Vital Signs BP 140/83 (BP Location: Right Arm)   Pulse 82   Temp 98.7 F (37.1 C) (Oral)   Resp 18   Ht 5\' 5"  (1.651 m)   Wt 111.6 kg   LMP  (LMP Unknown)   SpO2 96%   BMI 40.94 kg/m   Physical Exam Vitals signs and nursing note reviewed.  Constitutional:      General: She is not in acute distress.    Appearance: She is well-developed.  HENT:     Head: Normocephalic and atraumatic.  Eyes:     Conjunctiva/sclera: Conjunctivae normal.  Neck:     Musculoskeletal: Neck supple.  Cardiovascular:     Rate and Rhythm: Normal rate and regular rhythm.     Heart sounds: No murmur.  Pulmonary:     Effort: Pulmonary effort is normal. No respiratory distress.     Breath sounds: Normal breath sounds.    Musculoskeletal:        General: No swelling.     Comments: Tenderness to palpation over medial aspect of right knee; no effusion appreciated. No obvious swelling compared to right knee. Crepitus with passive ROM.   Skin:    General: Skin is warm and dry.  Neurological:     Mental Status: She is alert.      ED Treatments / Results  Labs (all labs ordered are listed, but only abnormal results are displayed) Labs Reviewed - No data to display  EKG None  Radiology Dg Knee Complete 4 Views Right  Result Date: 01/04/2019 CLINICAL DATA:  Right medial knee pain. EXAM: RIGHT KNEE - COMPLETE 4+ VIEW COMPARISON:  None. FINDINGS: No evidence of fracture, dislocation, or joint effusion. Subtle wispy calcifications within joint space. Soft tissues are unremarkable. IMPRESSION: No acute fracture or dislocation identified about the right knee. 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 Electronically Signed   By: Fidela Salisbury M.D.   On: 01/04/2019 10:18    Procedures Procedures (including critical care time)  Medications Ordered in ED Medications  ibuprofen (ADVIL,MOTRIN) tablet 800 mg (800 mg Oral Given 01/04/19 0955)     Initial Impression / Assessment and Plan / ED Course  I have reviewed the triage vital signs and the nursing notes.  Pertinent labs & imaging results that were available during my care of the patient were reviewed by me and considered in my medical decision making (see chart for details).    Presents with right knee pain that began last night, worse this morning upon waking up. No fever, chills. No obvious swelling, erythema, or warmth to the area. Low suspicion for acute gouty arthritis vs septic arthritis. Pt unable to tolerate mechanical movement of knee without pain although able to feel crepitus with mild flexion and extension. Will give 800 mg Ibuprofen for pain and get DG R Knee to assess for arthritis; suspect arthritic changes due to crepitus and pt's presentation. Will  reassess after pain medication.   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 Upon reeval pt states she is feeling improvement with ibuprofen while in the ED. Joint without erythema or warmth; will treat with low dose indomethacin and discharge home with close follow up with PCP. Pt in agreement with plan. Stable for discharge.  Final Clinical Impressions(s) / ED Diagnoses   Final diagnoses:  Acute pain of right knee  Pseudogout    ED Discharge Orders         Ordered    indomethacin (INDOCIN) 25 MG capsule  3 times daily PRN,   Status:  Discontinued     01/04/19 1044    indomethacin (INDOCIN) 25 MG capsule  3 times daily PRN     01/04/19 1057           Eustaquio Maize, PA-C 01/04/19 1411    Tegeler, Gwenyth Allegra, MD 01/04/19 [phone]

## 2019-01-04 NOTE — Discharge Instructions (Signed)
Please take medication as prescribed  Apply ice to area for comfort Follow up with PCP regarding symptoms  Return to the ED for worsening pain, fever, chills, redness or warmth to the knee

## 2019-01-05 ENCOUNTER — Other Ambulatory Visit: Payer: Self-pay

## 2019-01-05 ENCOUNTER — Emergency Department (HOSPITAL_COMMUNITY)
Admission: EM | Admit: 2019-01-05 | Discharge: 2019-01-05 | Disposition: A | Discharge status: Home / Self Care | Payer: BLUE CROSS/BLUE SHIELD | Attending: Emergency Medicine | Admitting: Emergency Medicine

## 2019-01-05 ENCOUNTER — Encounter (HOSPITAL_COMMUNITY): Payer: Self-pay | Admitting: Emergency Medicine

## 2019-01-05 DIAGNOSIS — Z79899 Other long term (current) drug therapy: Secondary | ICD-10-CM | POA: Insufficient documentation

## 2019-01-05 DIAGNOSIS — M25561 Pain in right knee: Secondary | ICD-10-CM | POA: Diagnosis present

## 2019-01-05 DIAGNOSIS — I10 Essential (primary) hypertension: Secondary | ICD-10-CM | POA: Insufficient documentation

## 2019-01-05 DIAGNOSIS — Z87891 Personal history of nicotine dependence: Secondary | ICD-10-CM | POA: Diagnosis not present

## 2019-01-05 MED ORDER — HYDROCODONE-ACETAMINOPHEN 5-325 MG PO TABS
2.0000 | ORAL_TABLET | Freq: Once | ORAL | Status: AC
  Administered 2019-01-05: 2 via ORAL
  Filled 2019-01-05: qty 2

## 2019-01-05 MED ORDER — HYDROCODONE-ACETAMINOPHEN 5-325 MG PO TABS: 1.0000 | ORAL_TABLET | Freq: Four times a day (QID) | ORAL | 0 refills | Status: DC | PRN

## 2019-01-05 MED ORDER — PREDNISONE 10 MG PO TABS: 20.0000 mg | ORAL_TABLET | Freq: Two times a day (BID) | ORAL | 0 refills | Status: DC

## 2019-01-05 NOTE — ED Provider Notes (Signed)
[place] EMERGENCY DEPARTMENT Provider Note   CSN: [id number] Arrival date & time: 01/05/19  5366    History   Chief Complaint Chief Complaint  Patient presents with  . Leg Pain    HPI Sarah Singh is a 53 y.o. female.     Patient is a 53 year old female with past medical history of hypertension.  She presents today with complaints of right knee pain.  This started yesterday Singh in the absence of any specific injury or trauma.  She was seen here, had x-rays, and was discharged with anti-inflammatory medication.  She woke up this Singh and the pain is worse.  She is having pain with ambulation, relieved with rest.  She denies headache fevers or chills.  The history is provided by the patient.  Leg Pain  Location:  Knee Time since incident:  24 hours Knee location:  R knee Pain details:    Quality:  Aching   Radiates to:  Does not radiate   Severity:  Severe   Onset quality:  Sudden   Timing:  Constant   Progression:  Worsening Chronicity:  New   Past Medical History:  Diagnosis Date  . Prediabetes 12/18/2014  . Vitamin D deficiency 2016    Patient Active Problem List   Diagnosis Date Noted  . Decreased visual acuity 10/29/2018  . Guaiac + stool 10/29/2018  . 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 Essential hypertension 07/25/2018  . Bilateral carpal tunnel syndrome 07/25/2018  . Wax in ear 12/18/2014  . Hot flashes 12/18/2014  . Prediabetes 12/18/2014    Past Surgical History:  Procedure Laterality Date  .  TM tubes     . TUBAL LIGATION  1992     OB History    Gravida  2   Para  2   Term      Preterm      AB      Living  2     SAB      TAB      Ectopic      Multiple      Live Births               Home Medications    Prior to Admission medications   Medication Sig Start Date End Date Taking? Authorizing Provider  fluconazole (DIFLUCAN) 150 MG tablet 1 tab by mouth for one  dose. 11/02/18   Mack Hook, MD  hydrochlorothiazide (HYDRODIURIL) 25 MG tablet Take 1 tablet (25 mg total) by mouth daily. 04/03/18   Mack Hook, MD  indomethacin (INDOCIN) 25 MG capsule Take 1 capsule (25 mg total) by mouth 3 (three) times daily as needed for up to 10 days. 01/04/19 01/14/19  Eustaquio Maize, PA-C  Multiple Vitamins-Minerals (MULTIVITAMIN ADULTS 50+ PO) Take 1 tablet by mouth daily.    [provider]    Family History Family History  Problem Relation Age of Onset  . Hypertension Mother   . Diabetes Sister   . Cancer Brother 55       throat cancer   . Cancer Father     Social History Social History   Tobacco Use  . Smoking status: Former Smoker    Types: Cigarettes    Start date: 03/08/1980    Last attempt to quit: 03/08/2013    Years since quitting: 5.8  . Smokeless tobacco: Never Used  Substance Use Topics  . Alcohol use: No    Alcohol/week: 0.0 standard drinks  Comment: History of abuse.  Stopped in 2011  . Drug use: No    Types: Cocaine    Comment: no use since 2010.      Allergies   Other   Review of Systems Review of Systems  All other systems reviewed and are negative.    Physical Exam Updated Vital Signs BP 101/88 (BP Location: Right Arm)   Pulse 88   Temp 98 F (36.7 C) (Oral)   Resp 18   Ht 5\' 5"  (1.651 m)   Wt 112 kg   LMP  (LMP Unknown)   SpO2 100%   BMI 41.09 kg/m   Physical Exam Vitals signs and nursing note reviewed.  Constitutional:      Appearance: Normal appearance.  Pulmonary:     Effort: Pulmonary effort is normal.  Musculoskeletal:     Comments: The right knee appears grossly normal.  There is no obvious effusion.  She has pain with range of motion.  There is no warmth or redness.  Exam is somewhat limited secondary to pain, however there appears to be no laxity with anterior/posterior drawer and varus/valgus stress.  Distal PMS is intact.  Skin:    General: Skin is warm and dry.    Neurological:     Mental Status: She is alert and oriented to person, place, and time.      ED Treatments / Results  Labs (all labs ordered are listed, but only abnormal results are displayed) Labs Reviewed - No data to display  EKG None  Radiology Dg Knee Complete 4 Views Right  Result Date: 01/04/2019 CLINICAL DATA:  Right medial knee pain. EXAM: RIGHT KNEE - COMPLETE 4+ VIEW COMPARISON:  None. FINDINGS: No evidence of fracture, dislocation, or joint effusion. Subtle wispy calcifications within joint space. Soft tissues are unremarkable. IMPRESSION: No acute fracture or dislocation identified about the right knee. 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 Electronically Signed   By: Fidela Salisbury M.D.   On: 01/04/2019 10:18    Procedures Procedures (including critical care time)  Medications Ordered in ED Medications  HYDROcodone-acetaminophen (NORCO/VICODIN) 5-325 MG per tablet 2 tablet (has no administration in time range)     Initial Impression / Assessment and Plan / ED Course  I have reviewed the triage vital signs and the nursing notes.  Pertinent labs & imaging results that were available during my care of the patient were reviewed by me and considered in my medical decision making (see chart for details).  Patient with nontraumatic right knee pain.  There is no effusion or warmth.  I highly doubt a septic joint.  I suspect this is related to degenerative joint disease.  She will be treated with prednisone, and a as are, crutches, pain medication, and follow-up with orthopedics if not improving in the next week.  Final Clinical Impressions(s) / ED Diagnoses   Final diagnoses:  None    ED Discharge Orders    None       Veryl Speak, MD 01/05/19 [phone]

## 2019-01-05 NOTE — ED Triage Notes (Signed)
Pt with right knee pain. Seen here yesterday with dx of pseudogout.

## 2019-01-05 NOTE — Discharge Instructions (Addendum)
Prednisone as prescribed.  Hydrocodone as prescribed as needed for pain.  Wear knee brace for the next several days, then slowly reintroduce activity as tolerated.  If you are not improving through the weekend, follow-up with your orthopedist to be reevaluated.

## 2019-01-05 NOTE — ED Notes (Signed)
Patient verbalizes understanding of discharge instructions. Opportunity for questioning and answers were provided. Armband removed by staff, pt discharged from ED in wheelchair.  

## 2019-01-26 ENCOUNTER — Ambulatory Visit: Payer: Self-pay | Admitting: Internal Medicine

## 2019-03-02 ENCOUNTER — Ambulatory Visit: Payer: Self-pay | Admitting: Internal Medicine

## 2019-04-04 ENCOUNTER — Ambulatory Visit: Payer: BLUE CROSS/BLUE SHIELD | Admitting: Internal Medicine

## 2019-04-04 ENCOUNTER — Other Ambulatory Visit: Payer: Self-pay

## 2019-04-04 ENCOUNTER — Encounter: Payer: Self-pay | Admitting: Internal Medicine

## 2019-04-04 VITALS — BP 130/72 | HR 66 | Resp 12 | Ht 63.0 in | Wt 250.0 lb

## 2019-04-04 DIAGNOSIS — Z6841 Body Mass Index (BMI) 40.0 and over, adult: Secondary | ICD-10-CM

## 2019-04-04 DIAGNOSIS — R195 Other fecal abnormalities: Secondary | ICD-10-CM | POA: Diagnosis not present

## 2019-04-04 DIAGNOSIS — I10 Essential (primary) hypertension: Secondary | ICD-10-CM

## 2019-04-04 DIAGNOSIS — R7303 Prediabetes: Secondary | ICD-10-CM

## 2019-04-04 NOTE — Patient Instructions (Signed)
Drink a glass of water before every meal Drink 6-8 glasses of water daily Eat three meals daily Eat a protein and healthy fat with every meal (eggs,fish, chicken, [place] and limit red meats) Eat 5 servings of vegetables daily, mix the colors Eat 2 servings of fruit daily with skin, if skin is edible Use smaller plates Put food/utensils down as you chew and swallow each bite Eat at a table with friends/family at least once daily, no TV Do not eat in front of the TV  Recent studies show that people who consume all of their calories in a 12 hour period lose weight more efficiently.  For example, if you eat your first meal at 7:00 a.m., your last meal of the day should be completed by 7:00 p.m.  Try to get going on regular physical activity

## 2019-04-04 NOTE — Progress Notes (Signed)
    Subjective:    Patient ID: Sarah Singh, female   DOB: June 16, 1966, 53 y.o.   MRN: [id number]   HPI   1.  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  Is very honest she has not really made any attempt to change her lifestyle. Recognizes she physically is unable to do things due to her weight. Discussed setting small goals to get started and gradually add on a weekly basis.  2.  Hypertension:  Controlled with HCTZ despite increased weight.  3.  Guaiac + stool:  Had [facility] orange card previously when original referral sent.  Since then, obtained [employer] insurance, so referral never went through.   Referring again.    Current Meds  Medication Sig  . hydrochlorothiazide (HYDRODIURIL) 25 MG tablet Take 1 tablet (25 mg total) by mouth daily.  . Multiple Vitamins-Minerals (MULTIVITAMIN ADULTS 50+ PO) Take 1 tablet by mouth daily.   Allergies  Allergen Reactions  . 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  Radial and DP pulses normal and equal Abd:  S, NT, No HSM or mass, + BS LE:  No edema   Assessment & Plan  1.  Prediabetes and obesity:  Small doable goals with diet and physical activity and gradually add weekly. A1C Follow up in 3 months.  2.  Hypertension:  Controlled  3.  Guaiac + Stool:  rerefer.

## 2019-04-05 ENCOUNTER — Encounter: Payer: Self-pay | Admitting: Gastroenterology

## 2019-04-05 LAB — HGB A1C W/O EAG: Hgb A1c MFr Bld: 6.1 % — ABNORMAL HIGH (ref 4.8–5.6)

## 2019-04-16 ENCOUNTER — Other Ambulatory Visit: Payer: Self-pay

## 2019-04-16 MED ORDER — HYDROCHLOROTHIAZIDE 25 MG PO TABS: 25.0000 mg | ORAL_TABLET | Freq: Every day | ORAL | 11 refills | Status: DC

## 2019-04-17 ENCOUNTER — Telehealth: Payer: Self-pay | Admitting: Internal Medicine

## 2019-04-17 DIAGNOSIS — R195 Other fecal abnormalities: Secondary | ICD-10-CM

## 2019-04-17 NOTE — Telephone Encounter (Signed)
Patient called stating  the Colonoscopy referral, [facility] do not take [employer] as health insurance. Patient requesting a call back from the nurse to know  where she can had it done.  Please advise.

## 2019-04-17 NOTE — Telephone Encounter (Signed)
Spoke with [facility]. States patient has BCBS local which means she can only go to [facility] to see GI specialist. State it has to do with the type of plan patient has.   To Dr. Amil Amen to put in referral to [facility]

## 2019-04-23 NOTE — Telephone Encounter (Signed)
Spoke with patient. States she is fine to go to [place]. Will call and schedule appointment tomorrow.

## 2019-05-09 ENCOUNTER — Encounter: Payer: BLUE CROSS/BLUE SHIELD | Admitting: Gastroenterology

## 2019-06-24 DIAGNOSIS — Z6841 Body Mass Index (BMI) 40.0 and over, adult: Secondary | ICD-10-CM | POA: Insufficient documentation

## 2019-07-06 ENCOUNTER — Encounter: Payer: Self-pay | Admitting: Internal Medicine

## 2019-07-06 ENCOUNTER — Ambulatory Visit: Payer: BLUE CROSS/BLUE SHIELD | Admitting: Internal Medicine

## 2019-07-06 ENCOUNTER — Other Ambulatory Visit: Payer: Self-pay

## 2019-07-06 VITALS — BP 130/80 | HR 64 | Temp 98.6°F | Resp 12 | Ht 63.0 in | Wt 242.0 lb

## 2019-07-06 DIAGNOSIS — Z6841 Body Mass Index (BMI) 40.0 and over, adult: Secondary | ICD-10-CM

## 2019-07-06 DIAGNOSIS — I10 Essential (primary) hypertension: Secondary | ICD-10-CM | POA: Diagnosis not present

## 2019-07-06 DIAGNOSIS — R195 Other fecal abnormalities: Secondary | ICD-10-CM

## 2019-07-06 DIAGNOSIS — E66813 Obesity, class 3: Secondary | ICD-10-CM

## 2019-07-06 DIAGNOSIS — R7303 Prediabetes: Secondary | ICD-10-CM | POA: Diagnosis not present

## 2019-07-06 NOTE — Progress Notes (Signed)
    Subjective:    Patient ID: Sarah Singh, female   DOB: 1966-10-06, 53 y.o.   MRN: [id number]   HPI   1.  Obesity and prediabetes:  She is working at [employer] on [employer].  She is also working a Nurse, adult job.  Walking a lot at work.  Not really on her off hours She has changed her diet.  Eating 2 vegetables --cabbage and green beans.  She is eating more baked food.  Eating a lot of fruit.   Has lost 8 lbs.    2.  Hypertension:  Continues HCTZ and weight change as above.     Current Meds  Medication Sig  . hydrochlorothiazide (HYDRODIURIL) 25 MG tablet Take 1 tablet (25 mg total) by mouth daily.  . Multiple Vitamins-Minerals (MULTIVITAMIN ADULTS 50+ PO) Take 1 tablet by mouth daily.   Allergies  Allergen Reactions  . 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  Obesity and prediabetes:  Encouraged joining [employer] for pool exercise or stationary bike. To continue with improved diet.   FLP/A1C in 3 months and appt with me in follow up.  2.  Hypertension:  Fair control.  Encouraged continued weight loss.  3.  Guaiac + stool:  Looking back at record, appears she canceled her GI referral x 2.  Will need to address.  Pandemic has made these evaluations difficult

## 2019-07-06 NOTE — Patient Instructions (Signed)
[facility] [address] [place], [place] [address]  [phone] Hours of Operation Mondays to Thursdays: 8 am to 8 pm Fridays: 9 am to 8 pm Saturdays: 9 am to 1 pm Sundays: Closed  

## 2019-10-09 ENCOUNTER — Other Ambulatory Visit: Payer: Self-pay

## 2019-10-09 ENCOUNTER — Ambulatory Visit: Payer: BLUE CROSS/BLUE SHIELD | Admitting: Internal Medicine

## 2019-10-09 ENCOUNTER — Encounter: Payer: Self-pay | Admitting: Internal Medicine

## 2019-10-09 VITALS — BP 124/78 | HR 70 | Resp 12 | Ht 63.0 in | Wt 241.0 lb

## 2019-10-09 DIAGNOSIS — M62838 Other muscle spasm: Secondary | ICD-10-CM | POA: Diagnosis not present

## 2019-10-09 DIAGNOSIS — Z6841 Body Mass Index (BMI) 40.0 and over, adult: Secondary | ICD-10-CM

## 2019-10-09 DIAGNOSIS — D1722 Benign lipomatous neoplasm of skin and subcutaneous tissue of left arm: Secondary | ICD-10-CM | POA: Diagnosis not present

## 2019-10-09 NOTE — Progress Notes (Signed)
    Subjective:    Patient ID: Sarah Singh, female   DOB: 26-Jun-1966, 53 y.o.   MRN: [id number]   HPI  1.  Obesity:  Has lost 1 lb over past 3 months. Admits she could be better with eating fried chicken and eating more veggies.   She is walking only when she eats a lot.  About 30 minutes.    2.  Left triceps area cramping pain and soreness radiates down dorsal left arm.  Then gets swelling into dorsal wrist.   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 She is unable to drink fluids at her work.  She needs a note to drink water.      Current Meds  Medication Sig  . hydrochlorothiazide (HYDRODIURIL) 25 MG tablet Take 1 tablet (25 mg total) by mouth daily.  . Multiple Vitamins-Minerals (MULTIVITAMIN ADULTS 50+ PO) Take 1 tablet by mouth daily.   Allergies  Allergen Reactions  . 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 Mildly tender over left triceps Lungs:  CTA CV:  RRR without murmur or rub.  Radial pulses normal and equal  Assessment & Plan  1.  Obesity:  Make goals for 30 minutes of walking at same time each day and weekly goals for increase until get to 60 minutes daily. Goals for diet as well   2.  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  Triceps stretches.  Watch for rapid enlargement of left forearm fatty area.

## 2019-10-09 NOTE — Patient Instructions (Signed)
Goal of 30 minute walk every day at same time Increase by however many minutes you choose on a weekly basis and work up to 60 minutes a day. Stretch out your upper arms every day before work as discussed today.

## 2019-10-18 ENCOUNTER — Telehealth: Payer: Self-pay | Admitting: Internal Medicine

## 2019-10-18 NOTE — Telephone Encounter (Signed)
Patient returned call and information on needs to quarantine is accurate. Patient informed to call if any problems. Verbalized understanding.

## 2019-10-18 NOTE — Telephone Encounter (Signed)
Patient called stating got exposed for [place] last Saturday and got tested yesterday obtaining negative results. Patient stated did not have any symptoms but was advised has to quarantine anyways . Patient requesting advise  from Dr. Amil Amen  If that information is accurate.  Please advise.

## 2019-10-18 NOTE — Telephone Encounter (Signed)
Discussed with Dr. Amil Amen.  Yes that is accurate information and patient needs to quarantine. Pt. To call if any problems

## 2019-10-18 NOTE — Telephone Encounter (Signed)
LVM with advise

## 2019-12-09 ENCOUNTER — Encounter: Payer: Self-pay | Admitting: Internal Medicine

## 2020-01-08 ENCOUNTER — Ambulatory Visit: Payer: BLUE CROSS/BLUE SHIELD | Admitting: Internal Medicine

## 2020-02-05 ENCOUNTER — Other Ambulatory Visit: Payer: Self-pay

## 2020-02-05 ENCOUNTER — Encounter: Payer: Self-pay | Admitting: Internal Medicine

## 2020-02-05 ENCOUNTER — Ambulatory Visit: Payer: Self-pay | Admitting: Internal Medicine

## 2020-02-05 VITALS — BP 128/88 | HR 70 | Resp 12 | Ht 63.0 in | Wt 247.0 lb

## 2020-02-05 DIAGNOSIS — I10 Essential (primary) hypertension: Secondary | ICD-10-CM

## 2020-02-05 DIAGNOSIS — R7303 Prediabetes: Secondary | ICD-10-CM

## 2020-02-05 DIAGNOSIS — Z6841 Body Mass Index (BMI) 40.0 and over, adult: Secondary | ICD-10-CM

## 2020-02-05 NOTE — Progress Notes (Signed)
    Subjective:    Patient ID: Sarah Singh, female   DOB: Mar 19, 1966, 54 y.o.   MRN: [id number]   HPI   1.  Weight issues:  Feels her vitamins are making her eat more--make her hungry.   Breakfast:  2 boiled eggs and 3 strips bacon for breakfast.  Gatorade or orange juice.  Lunch:  [employer] sandwich  Dinner:  Oodles of Noodles. Will eat spinach and green beans regularly.  Generally with evening meal.  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   Current Meds  Medication Sig  . hydrochlorothiazide (HYDRODIURIL) 25 MG tablet Take 1 tablet (25 mg total) by mouth daily.  . Multiple Vitamins-Minerals (MULTIVITAMIN ADULTS 50+ PO) Take 1 tablet by mouth daily.   Allergies  Allergen Reactions  . 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  Radial and DP pulses normal and equal    Assessment & Plan  1.  Morbid obesity with prediabetes and hypertension:  Not really making any headway.  No clear goals currently. She will consider going to [facility] to walk with her grandson regularly. Discussed cutting back on carbs and sweet drinks. Ileana Tol to call for counseling--help with goal setting and moving from food as comfort perhaps, particularly with her history of addiction. She will call for follow up rather than set up a specific time period.

## 2020-02-14 ENCOUNTER — Telehealth: Payer: Self-pay | Admitting: Clinical

## 2020-02-14 NOTE — Telephone Encounter (Signed)
LCSW contacted patient to see if she was still interested in counseling; patient reports if able to call back after 4pm due to being at work.

## 2020-02-14 NOTE — Telephone Encounter (Signed)
LCSW contacted patient who had requested to call back after 4; called at 4:12pm left message with copay amount and availability if she is still interested for counseling.

## 2020-04-13 ENCOUNTER — Other Ambulatory Visit: Payer: Self-pay | Admitting: Internal Medicine

## 2020-04-14 ENCOUNTER — Telehealth: Payer: Self-pay | Admitting: Internal Medicine

## 2020-04-14 NOTE — Telephone Encounter (Signed)
Patient called requesting Rx on hydrochlorothiazide (HYDRODIURIL) 25 MG tablet. Patient will like to get notified when prescription sent; also patient stating is ok to leave a vm with information in case she is at work and cannot answers call.

## 2020-04-14 NOTE — Telephone Encounter (Signed)
Rx was refilled by other means. Rx was e-scripts when I came in this morning.

## 2020-09-30 ENCOUNTER — Telehealth: Payer: Self-pay | Admitting: Internal Medicine

## 2020-09-30 NOTE — Telephone Encounter (Signed)
Spoke with patient informed her to take her bottle with refills to [facility] and they will transfer her refills. This is a Educational psychologist to [employer] transfer.

## 2020-10-09 ENCOUNTER — Other Ambulatory Visit: Payer: Self-pay | Admitting: Internal Medicine

## 2020-11-01 DIAGNOSIS — M179 Osteoarthritis of knee, unspecified: Secondary | ICD-10-CM | POA: Insufficient documentation

## 2020-11-01 HISTORY — DX: Osteoarthritis of knee, unspecified: M17.9

## 2020-11-19 ENCOUNTER — Other Ambulatory Visit: Payer: Self-pay

## 2020-11-19 ENCOUNTER — Encounter (HOSPITAL_COMMUNITY): Payer: Self-pay | Admitting: Emergency Medicine

## 2020-11-19 ENCOUNTER — Ambulatory Visit (INDEPENDENT_AMBULATORY_CARE_PROVIDER_SITE_OTHER): Payer: 59

## 2020-11-19 ENCOUNTER — Ambulatory Visit (HOSPITAL_COMMUNITY)
Admission: EM | Admit: 2020-11-19 | Discharge: 2020-11-19 | Disposition: A | Discharge status: Home / Self Care | Payer: 59 | Attending: Family Medicine | Admitting: Family Medicine

## 2020-11-19 DIAGNOSIS — M25461 Effusion, right knee: Secondary | ICD-10-CM

## 2020-11-19 DIAGNOSIS — M25561 Pain in right knee: Secondary | ICD-10-CM | POA: Diagnosis not present

## 2020-11-19 MED ORDER — KETOROLAC TROMETHAMINE 60 MG/2ML IM SOLN
INTRAMUSCULAR | Status: AC
  Filled 2020-11-19: qty 2

## 2020-11-19 MED ORDER — PREDNISONE 50 MG PO TABS: ORAL_TABLET | ORAL | 0 refills | Status: DC

## 2020-11-19 MED ORDER — KETOROLAC TROMETHAMINE 60 MG/2ML IM SOLN
60.0000 mg | Freq: Once | INTRAMUSCULAR | Status: AC
  Administered 2020-11-19: 60 mg via INTRAMUSCULAR

## 2020-11-19 NOTE — ED Provider Notes (Signed)
[place]    CSN: [id number] Arrival date & time: 11/19/20  0805      History   Chief Complaint Chief Complaint  Patient presents with  . Knee Pain    HPI Sarah Singh is a 55 y.o. female.   Here today with 5 day history of acute on chronic right knee pain and swelling. States the pain started after a very long shift on her feet at [employer]. This happens from time to time to her per patient but no known cause identified in the past. Has been trying to stay off the leg and taking tylenol without relief. No known hx of injury to the knee.      Past Medical History:  Diagnosis Date  . Essential hypertension 07/25/2018  . Prediabetes 12/18/2014  . Vitamin D deficiency 2016    Patient Active Problem List   Diagnosis Date Noted  . 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 Class 3 severe obesity with serious comorbidity and body mass index (BMI) of 40.0 to 44.9 in adult [facility]) 06/24/2019  . Decreased visual acuity 10/29/2018  . Guaiac + stool 10/29/2018  . 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 Essential hypertension 07/25/2018  . Bilateral carpal tunnel syndrome 07/25/2018  . Wax in ear 12/18/2014  . Hot flashes 12/18/2014  . Prediabetes 12/18/2014    Past Surgical History:  Procedure Laterality Date  .  TM tubes     . TUBAL LIGATION  1992    OB History    Gravida  2   Para  2   Term      Preterm      AB      Living  2     SAB      IAB      Ectopic      Multiple      Live Births               Home Medications    Prior to Admission medications   Medication Sig Start Date End Date Taking? Authorizing Provider  predniSONE (DELTASONE) 50 MG tablet Take 1 tab daily with breakfast for 5 days 11/19/20  Yes Volney American, PA-C  fluconazole (DIFLUCAN) 150 MG tablet 1 tab by mouth for one dose. Patient not taking: Reported on 04/04/2019 11/02/18   Mack Hook, MD  hydrochlorothiazide (HYDRODIURIL)  25 MG tablet Take 1 tablet by mouth once daily 10/09/20   Mack Hook, MD  HYDROcodone-acetaminophen (NORCO) 5-325 MG tablet Take 1-2 tablets by mouth every 6 (six) hours as needed. Patient not taking: Reported on 04/04/2019 01/05/19   Veryl Speak, MD  Multiple Vitamins-Minerals (MULTIVITAMIN ADULTS 50+ PO) Take 1 tablet by mouth daily.    [provider]  predniSONE (DELTASONE) 10 MG tablet Take 2 tablets (20 mg total) by mouth 2 (two) times daily with a meal. Patient not taking: Reported on 04/04/2019 01/05/19   Veryl Speak, MD    Family History Family History  Problem Relation Age of Onset  . Hypertension Mother   . Diabetes Sister   . Cancer Brother 55       throat cancer   . Cancer Father     Social History Social History   Tobacco Use  . Smoking status: Former Smoker    Types: Cigarettes    Start date: 03/08/1980    Quit date: 03/08/2013    Years since quitting: 7.7  . Smokeless tobacco: Never Used  Substance  Use Topics  . Alcohol use: No    Alcohol/week: 0.0 standard drinks    Comment: History of abuse.  Stopped in 2011  . Drug use: No    Types: Cocaine    Comment: no use since 2010.      Allergies   Other   Review of Systems Review of Systems PER HPI   Physical Exam Triage Vital Signs ED Triage Vitals  Enc Vitals Group     BP 11/19/20 0826 137/70     Pulse Rate 11/19/20 0826 73     Resp 11/19/20 0826 20     Temp 11/19/20 0826 98.8 F (37.1 C)     Temp Source 11/19/20 0826 Oral     SpO2 11/19/20 0826 97 %     Weight --      Height --      Head Circumference --      Peak Flow --      Pain Score 11/19/20 0825 10     Pain Loc --      Pain Edu? --      Excl. in [place]? --    No data found.  Updated Vital Signs BP 137/70 (BP Location: Left Arm)   Pulse 73   Temp 98.8 F (37.1 C) (Oral)   Resp 20   LMP  (LMP Unknown)   SpO2 97%   Visual Acuity Right Eye Distance:   Left Eye Distance:   Bilateral Distance:    Right Eye Near:    Left Eye Near:    Bilateral Near:     Physical Exam Vitals and nursing note reviewed.  Constitutional:      Appearance: Normal appearance. She is not ill-appearing.  HENT:     Head: Atraumatic.  Eyes:     Extraocular Movements: Extraocular movements intact.     Conjunctiva/sclera: Conjunctivae normal.  Cardiovascular:     Rate and Rhythm: Normal rate and regular rhythm.     Pulses: Normal pulses.     Heart sounds: Normal heart sounds.  Pulmonary:     Effort: Pulmonary effort is normal.     Breath sounds: Normal breath sounds.  Musculoskeletal:        General: Swelling (mild diffuse right knee edema) and tenderness (diffuse ttp right knee, worse b/l joint lines) present. No deformity or signs of injury. Normal range of motion.     Cervical back: Normal range of motion and neck supple.     Comments: No calf ttp, - squeeze test  Skin:    General: Skin is warm and dry.     Findings: No bruising or erythema.  Neurological:     Mental Status: She is alert and oriented to person, place, and time.     Sensory: No sensory deficit.  Psychiatric:        Thought Content: Thought content normal.        Judgment: Judgment normal.     Comments: tearful      UC Treatments / Results  Labs (all labs ordered are listed, but only abnormal results are displayed) Labs Reviewed - No data to display  EKG   Radiology DG Knee Complete 4 Views Right  Result Date: 11/19/2020 CLINICAL DATA:  Right knee pain over the last 5 days. Swelling. Limited range of motion. EXAM: RIGHT KNEE - COMPLETE 4+ VIEW COMPARISON:  01/04/2019 FINDINGS: Suspected 0.4 by 0.3 by 0.4 cm free osteochondral fragment posterior to the medial compartment. Mild spurring along the intercondylar notch, tibial spine,  and along the margins of the 3 compartments of the knee. Mild medial compartmental articular space narrowing. Trace knee joint effusion. No fracture or acute bony findings. Mild prepatellar subcutaneous edema  medially. IMPRESSION: 1. Suspected free osteochondral fragment posterior to the medial compartment. 2. Moderate tricompartmental osteoarthritis, with trace knee joint effusion. 3. Mild prepatellar subcutaneous edema medially. Electronically Signed   By: Van Clines M.D.   On: 11/19/2020 09:24    Procedures Procedures (including critical care time)  Medications Ordered in UC Medications  ketorolac (TORADOL) injection 60 mg (60 mg Intramuscular Given 11/19/20 0950)    Initial Impression / Assessment and Plan / UC Course  I have reviewed the triage vital signs and the nursing notes.  Pertinent labs & imaging results that were available during my care of the patient were reviewed by me and considered in my medical decision making (see chart for details).     X-ray right knee showing moderate OA and possibly a small free fragment in medial joint space. Will treat with prednisone burst, IM toradol, work note. Sports med follow up recommended given chronicity of issue.   Final Clinical Impressions(s) / UC Diagnoses   Final diagnoses:  Acute pain of right knee   Discharge Instructions   None    ED Prescriptions    Medication Sig Dispense Auth. Provider   predniSONE (DELTASONE) 50 MG tablet Take 1 tab daily with breakfast for 5 days 5 tablet Volney American, [place]     PDMP not reviewed this encounter.   Volney American, [place] 11/20/20 1929

## 2020-11-19 NOTE — ED Triage Notes (Signed)
Pt presents with right knee pain xs 5 days.

## 2021-01-12 ENCOUNTER — Ambulatory Visit (HOSPITAL_COMMUNITY)
Admission: EM | Admit: 2021-01-12 | Discharge: 2021-01-12 | Disposition: A | Discharge status: Home / Self Care | Payer: 59 | Attending: Family Medicine | Admitting: Family Medicine

## 2021-01-12 ENCOUNTER — Other Ambulatory Visit: Payer: Self-pay

## 2021-01-12 ENCOUNTER — Encounter (HOSPITAL_COMMUNITY): Payer: Self-pay | Admitting: Emergency Medicine

## 2021-01-12 DIAGNOSIS — Z202 Contact with and (suspected) exposure to infections with a predominantly sexual mode of transmission: Secondary | ICD-10-CM | POA: Insufficient documentation

## 2021-01-12 MED ORDER — DOXYCYCLINE HYCLATE 100 MG PO TABS: 100.0000 mg | ORAL_TABLET | Freq: Two times a day (BID) | ORAL | 0 refills | Status: DC

## 2021-01-12 NOTE — ED Triage Notes (Signed)
Pt presents for STD testing after exposure. Denies any symptoms at this time.

## 2021-01-12 NOTE — ED Provider Notes (Signed)
[place]    CSN: [id number] Arrival date & time: 01/12/21  [place]      History   Chief Complaint Chief Complaint  Patient presents with  . Exposure to STD    HPI Sarah Singh is a 55 y.o. female.   Patient here today requesting STD check as a recent partner told her that they have chlamydia.  She denies any symptoms currently such as vaginal discharge, pelvic pain, rashes, dysuria, hematuria, fevers, nausea, vomiting.     Past Medical History:  Diagnosis Date  . Essential hypertension 07/25/2018  . Prediabetes 12/18/2014  . Vitamin D deficiency 2016    Patient Active Problem List   Diagnosis Date Noted  . 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 Class 3 severe obesity with serious comorbidity and body mass index (BMI) of 40.0 to 44.9 in adult [facility]) 06/24/2019  . Decreased visual acuity 10/29/2018  . Guaiac + stool 10/29/2018  . 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 Essential hypertension 07/25/2018  . Bilateral carpal tunnel syndrome 07/25/2018  . Wax in ear 12/18/2014  . Hot flashes 12/18/2014  . Prediabetes 12/18/2014    Past Surgical History:  Procedure Laterality Date  .  TM tubes     . TUBAL LIGATION  1992    OB History    Gravida  2   Para  2   Term      Preterm      AB      Living  2     SAB      IAB      Ectopic      Multiple      Live Births               Home Medications    Prior to Admission medications   Medication Sig Start Date End Date Taking? Authorizing Provider  doxycycline (VIBRA-TABS) 100 MG tablet Take 1 tablet (100 mg total) by mouth 2 (two) times daily. 01/12/21  Yes Volney American, PA-C  fluconazole (DIFLUCAN) 150 MG tablet 1 tab by mouth for one dose. Patient not taking: Reported on 04/04/2019 11/02/18   Mack Hook, MD  hydrochlorothiazide (HYDRODIURIL) 25 MG tablet Take 1 tablet by mouth once daily 10/09/20   Mack Hook, MD   HYDROcodone-acetaminophen (NORCO) 5-325 MG tablet Take 1-2 tablets by mouth every 6 (six) hours as needed. Patient not taking: Reported on 04/04/2019 01/05/19   Veryl Speak, MD  Multiple Vitamins-Minerals (MULTIVITAMIN ADULTS 50+ PO) Take 1 tablet by mouth daily.    [provider]  predniSONE (DELTASONE) 10 MG tablet Take 2 tablets (20 mg total) by mouth 2 (two) times daily with a meal. Patient not taking: Reported on 04/04/2019 01/05/19   Veryl Speak, MD  predniSONE (DELTASONE) 50 MG tablet Take 1 tab daily with breakfast for 5 days 11/19/20   Volney American, PA-C    Family History Family History  Problem Relation Age of Onset  . Hypertension Mother   . Diabetes Sister   . Cancer Brother 55       throat cancer   . Cancer Father     Social History Social History   Tobacco Use  . Smoking status: Former Smoker    Types: Cigarettes    Start date: 03/08/1980    Quit date: 03/08/2013    Years since quitting: 7.8  . Smokeless tobacco: Never Used  Substance Use Topics  . Alcohol use: No  Alcohol/week: 0.0 standard drinks    Comment: History of abuse.  Stopped in 2011  . Drug use: No    Types: Cocaine    Comment: no use since 2010.      Allergies   Other   Review of Systems Review of Systems Per HPI Physical Exam Triage Vital Signs ED Triage Vitals  Enc Vitals Group     BP 01/12/21 1929 (!) 155/80     Pulse Rate 01/12/21 1929 79     Resp 01/12/21 1929 18     Temp 01/12/21 1929 98.2 F (36.8 C)     Temp Source 01/12/21 1929 Oral     SpO2 01/12/21 1929 98 %     Weight --      Height --      Head Circumference --      Peak Flow --      Pain Score 01/12/21 1933 0     Pain Loc --      Pain Edu? --      Excl. in [place]? --    No data found.  Updated Vital Signs BP (!) 155/80 (BP Location: Right Arm)   Pulse 79   Temp 98.2 F (36.8 C) (Oral)   Resp 18   LMP  (LMP Unknown)   SpO2 98%   Visual Acuity Right Eye Distance:   Left Eye Distance:    Bilateral Distance:    Right Eye Near:   Left Eye Near:    Bilateral Near:     Physical Exam Vitals and nursing note reviewed.  Constitutional:      Appearance: Normal appearance. She is not ill-appearing.  HENT:     Head: Atraumatic.     Mouth/Throat:     Mouth: Mucous membranes are moist.     Pharynx: Oropharynx is clear.  Eyes:     Extraocular Movements: Extraocular movements intact.     Conjunctiva/sclera: Conjunctivae normal.  Cardiovascular:     Rate and Rhythm: Normal rate and regular rhythm.     Heart sounds: Normal heart sounds.  Pulmonary:     Effort: Pulmonary effort is normal.     Breath sounds: Normal breath sounds.  Abdominal:     General: Bowel sounds are normal. There is no distension.     Palpations: Abdomen is soft.     Tenderness: There is no abdominal tenderness. There is no right CVA tenderness, left CVA tenderness or guarding.  Genitourinary:    Comments: GU exam deferred, self swab performed Musculoskeletal:        General: Normal range of motion.     Cervical back: Normal range of motion and neck supple.  Skin:    General: Skin is warm and dry.  Neurological:     Mental Status: She is alert and oriented to person, place, and time.  Psychiatric:        Mood and Affect: Mood normal.        Thought Content: Thought content normal.        Judgment: Judgment normal.    UC Treatments / Results  Labs (all labs ordered are listed, but only abnormal results are displayed) Labs Reviewed  CERVICOVAGINAL ANCILLARY ONLY    EKG   Radiology No results found.  Procedures Procedures (including critical care time)  Medications Ordered in UC Medications - No data to display  Initial Impression / Assessment and Plan / UC Course  I have reviewed the triage vital signs and the nursing notes.  Pertinent labs &  imaging results that were available during my care of the patient were reviewed by me and considered in my medical decision making (see chart  for details).     Vaginal swab pending, will start treatment with doxycycline given known exposure and unprotected intercourse.  Discussed abstinence until results return and 1 week post completion of medication.  Safe sexual practices reviewed.  Final Clinical Impressions(s) / UC Diagnoses   Final diagnoses:  Exposure to STD   Discharge Instructions   None    ED Prescriptions    Medication Sig Dispense Auth. Provider   doxycycline (VIBRA-TABS) 100 MG tablet Take 1 tablet (100 mg total) by mouth 2 (two) times daily. 14 tablet Volney American, [place]     PDMP not reviewed this encounter.   Volney American, [place] 01/12/21 1947

## 2021-01-13 ENCOUNTER — Telehealth (HOSPITAL_COMMUNITY): Payer: Self-pay | Admitting: Emergency Medicine

## 2021-01-13 LAB — CERVICOVAGINAL ANCILLARY ONLY
Bacterial Vaginitis (gardnerella): NEGATIVE
Candida Glabrata: POSITIVE — AB
Candida Vaginitis: NEGATIVE
Chlamydia: NEGATIVE
Comment: NEGATIVE
Comment: NEGATIVE
Comment: NEGATIVE
Comment: NEGATIVE
Comment: NEGATIVE
Comment: NORMAL
Neisseria Gonorrhea: NEGATIVE
Trichomonas: NEGATIVE

## 2021-01-13 MED ORDER — FLUCONAZOLE 150 MG PO TABS: 150.0000 mg | ORAL_TABLET | Freq: Once | ORAL | 0 refills | Status: AC

## 2021-03-11 ENCOUNTER — Other Ambulatory Visit: Payer: Self-pay | Admitting: Internal Medicine

## 2021-03-12 NOTE — Telephone Encounter (Signed)
Patient has an appointment for 04/21/2021.

## 2021-04-21 ENCOUNTER — Encounter: Payer: 59 | Admitting: Internal Medicine

## 2021-06-01 ENCOUNTER — Telehealth: Payer: Self-pay

## 2021-06-01 MED ORDER — HYDROCHLOROTHIAZIDE 25 MG PO TABS: 25.0000 mg | ORAL_TABLET | Freq: Every day | ORAL | 1 refills | Status: DC

## 2021-06-01 NOTE — Telephone Encounter (Signed)
Pt called for refill of hydrochlorothiazide. Pt has appt on 08/19/21. Rx refilled until appt.

## 2021-07-03 ENCOUNTER — Encounter (HOSPITAL_COMMUNITY): Payer: Self-pay | Admitting: Emergency Medicine

## 2021-07-03 ENCOUNTER — Other Ambulatory Visit: Payer: Self-pay

## 2021-07-03 ENCOUNTER — Ambulatory Visit (INDEPENDENT_AMBULATORY_CARE_PROVIDER_SITE_OTHER): Payer: Self-pay

## 2021-07-03 ENCOUNTER — Ambulatory Visit (HOSPITAL_COMMUNITY)
Admission: EM | Admit: 2021-07-03 | Discharge: 2021-07-03 | Disposition: A | Discharge status: Home / Self Care | Payer: Self-pay | Attending: Emergency Medicine | Admitting: Emergency Medicine

## 2021-07-03 DIAGNOSIS — S46912A Strain of unspecified muscle, fascia and tendon at shoulder and upper arm level, left arm, initial encounter: Secondary | ICD-10-CM

## 2021-07-03 DIAGNOSIS — M25512 Pain in left shoulder: Secondary | ICD-10-CM

## 2021-07-03 DIAGNOSIS — M170 Bilateral primary osteoarthritis of knee: Secondary | ICD-10-CM

## 2021-07-03 MED ORDER — BACLOFEN 10 MG PO TABS: 10.0000 mg | ORAL_TABLET | Freq: Three times a day (TID) | ORAL | 0 refills | Status: DC

## 2021-07-03 MED ORDER — ACETAMINOPHEN 500 MG PO TABS: 1000.0000 mg | ORAL_TABLET | Freq: Three times a day (TID) | ORAL | 0 refills | Status: AC

## 2021-07-03 MED ORDER — METHYLPREDNISOLONE 4 MG PO TBPK: ORAL_TABLET | ORAL | 0 refills | Status: DC

## 2021-07-03 MED ORDER — BACLOFEN 10 MG PO TABS: 10.0000 mg | ORAL_TABLET | Freq: Every day | ORAL | 0 refills | Status: AC

## 2021-07-03 MED ORDER — ACETAMINOPHEN 500 MG PO TABS: 500.0000 mg | ORAL_TABLET | Freq: Four times a day (QID) | ORAL | 0 refills | Status: DC | PRN

## 2021-07-03 MED ORDER — KETOROLAC TROMETHAMINE 60 MG/2ML IM SOLN
INTRAMUSCULAR | Status: AC
  Filled 2021-07-03: qty 2

## 2021-07-03 MED ORDER — KETOROLAC TROMETHAMINE 60 MG/2ML IM SOLN
60.0000 mg | Freq: Once | INTRAMUSCULAR | Status: AC
  Administered 2021-07-03: 60 mg via INTRAMUSCULAR

## 2021-07-03 NOTE — ED Provider Notes (Signed)
[place]    CSN: [id number] Arrival date & time: 07/03/21  0803      History   Chief Complaint Chief Complaint  Patient presents with   Back Pain    HPI Sarah Singh is a 55 y.o. female.   Complains of left shoulder pain for the past 10 days.  Patient states she works at [employer] and does a lot of lifting moving pushing and pulling sometimes fairly large boxes.  Patient reports a history of arthritis in both knees diagnosed recently.  Patient states she is not currently taking any regular therapy for the arthritis.  Patient states she has been taking ibuprofen and Aleve as well as using a heating pad for her shoulder and has not had any relief.  Patient reports pain is keeping her from sleeping at night.  Patient states she does not like to go to work and pain and is here for pain relief so that she can get back to work.  Patient states she does not tolerate narcotics and is not requesting them.  Patient states when she was diagnosed with knee arthritis she was given an injection which helped tremendously, patient is asking if this can be repeated today.  Patient is also asking for x-ray of her shoulder to make sure its not arthritis.   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 Authorizing Provider  hydrochlorothiazide (HYDRODIURIL) 25 MG tablet Take 1 tablet (25 mg total) by mouth daily. 06/01/21  Yes Mack Hook, MD  Multiple Vitamins-Minerals (MULTIVITAMIN ADULTS 50+ PO) Take 1 tablet by mouth daily.   Yes [provider]  doxycycline (VIBRA-TABS) 100 MG tablet Take 1 tablet (100 mg total) by mouth 2 (two) times daily. 01/12/21   Volney American, PA-C  HYDROcodone-acetaminophen (NORCO) 5-325 MG tablet Take 1-2 tablets by mouth every 6 (six) hours as needed. Patient not taking: No sig reported 01/05/19   Veryl Speak, MD  predniSONE (DELTASONE) 10 MG tablet Take 2 tablets (20 mg total) by mouth 2 (two) times daily with a meal. Patient not taking: No sig reported 01/05/19   Veryl Speak, MD  predniSONE (DELTASONE) 50 MG tablet Take 1 tab daily with breakfast for 5 days 11/19/20   Volney American, PA-C    Family History Family History  Problem Relation Age of Onset   Hypertension Mother    Diabetes Sister    Cancer Brother 20       throat cancer    Cancer Father     Social History Social History   Tobacco Use   Smoking status: Former    Types: Cigarettes    Start date: 03/08/1980    Quit date: 03/08/2013    Years since quitting: 8.3   Smokeless tobacco: Never  Substance Use Topics   Alcohol use: No    Alcohol/week: 0.0 standard drinks    Comment: History of abuse.  Stopped in 2011   Drug use: No    Types: Cocaine    Comment: no use since 2010.      Allergies   Other   Review of Systems Review of Systems  Musculoskeletal:  Positive for arthralgias (Bilateral knees, left shoulder joint). Negative for back pain.  All other systems reviewed and are  negative.   Physical Exam Triage Vital Signs ED Triage Vitals  Enc Vitals Group     BP 07/03/21 0825 136/86     Pulse Rate 07/03/21 0825 62     Resp 07/03/21 0825 14     Temp 07/03/21 0825 97.9 F (36.6 C)     Temp Source 07/03/21 0825 Oral     SpO2 07/03/21 0825 100 %     Weight --      Height --      Head Circumference --      Peak Flow --      Pain Score 07/03/21 0824 8     Pain Loc --      Pain Edu? --      Excl. in [place]? --    No data found.  Updated Vital Signs BP 136/86 (BP Location: Left Wrist)   Pulse 62   Temp 97.9 F (36.6 C) (Oral)   Resp 14   LMP  (LMP Unknown)   SpO2 100%   Visual Acuity Right Eye Distance:   Left Eye Distance:   Bilateral Distance:    Right Eye Near:   Left Eye Near:    Bilateral Near:     Physical Exam Constitutional:      Appearance: Normal appearance. She is obese.  HENT:     Head: Normocephalic and atraumatic.  Eyes:     Pupils: Pupils are equal, round, and reactive to light.  Cardiovascular:     Rate and Rhythm: Normal rate and regular rhythm.  Pulmonary:     Effort: Pulmonary effort is normal.     Breath sounds: Normal breath sounds.  Musculoskeletal:        General: Tenderness (Left anterior superior and posterior rotator cuff) and deformity (Arthritic changes bilateral knees) present.  Skin:    General: Skin is warm and dry.  Neurological:     General: No focal deficit present.     Mental Status: She is alert and oriented to person, place, and time. Mental status is at baseline.  Psychiatric:        Mood and Affect: Mood normal.        Behavior: Behavior normal.        Thought Content: Thought content normal.        Judgment: Judgment normal.     UC Treatments / Results  Labs (  all labs ordered are listed, but only abnormal results are displayed) Labs Reviewed - No data to display  EKG   Radiology No results found.  Procedures Procedures (including critical care time)  Medications Ordered in  UC Medications - No data to display  Initial Impression / Assessment and Plan / UC Course  I have reviewed the triage vital signs and the nursing notes.  Pertinent labs & imaging results that were available during my care of the patient were reviewed by me and considered in my medical decision making (see chart for details).     XR performed today showed mild glenohumeral arthritic changes.  Patient was provided with a ketorolac injection in office.  Instructions were provided how to take Medrol Dosepak, patient advised to begin today.  Patient also advised to take baclofen 2 to 3 hours prior to bedtime for best results.  Other conservative care includes heat and ice to shoulder avoiding heavy lifting and rest.  Patient provided with note for work. Final Clinical Impressions(s) / UC Diagnoses   Final diagnoses:  Left shoulder strain, initial encounter  Bilateral primary osteoarthritis of knee   Discharge Instructions   None    ED Prescriptions   None    PDMP not reviewed this encounter.   Lynden Oxford, PA-C 07/03/21 [phone]

## 2021-07-03 NOTE — ED Triage Notes (Signed)
Patient c/o LFT upper back pain x 2 weeks.   Patient denies fall or trauma.   Patient states " I'm not sure when it started hurting, it may have been after picking something up at work".   History of Arthritis.  Patient has used Aleve, warm compress, and "back medicine", and Tylenol w/ no relief of symptoms.

## 2021-07-03 NOTE — Discharge Instructions (Addendum)
Your xray showed some mild arthritic changes however the most likely source of your current acute pain is related to strain of your rotator cuff.  Please do follow-up with your primary care regarding the arthritis in your knees and now in your left shoulder.  There are good interventional therapies that can slow the progression of arthritis.  Please take baclofen 2 to 3 hours prior to bedtime.  Please take the Medrol Dosepak as prescribed.

## 2021-08-11 ENCOUNTER — Other Ambulatory Visit: Payer: Self-pay

## 2021-08-11 MED ORDER — HYDROCHLOROTHIAZIDE 25 MG PO TABS: 25.0000 mg | ORAL_TABLET | Freq: Every day | ORAL | 0 refills | Status: DC

## 2021-08-19 ENCOUNTER — Other Ambulatory Visit: Payer: Self-pay | Admitting: Internal Medicine

## 2021-08-19 ENCOUNTER — Ambulatory Visit: Payer: Self-pay | Admitting: Internal Medicine

## 2021-08-19 ENCOUNTER — Encounter: Payer: Self-pay | Admitting: Internal Medicine

## 2021-08-19 ENCOUNTER — Other Ambulatory Visit: Payer: Self-pay

## 2021-08-19 VITALS — BP 154/94 | HR 72 | Resp 16 | Ht 63.75 in | Wt 260.0 lb

## 2021-08-19 DIAGNOSIS — Z118 Encounter for screening for other infectious and parasitic diseases: Secondary | ICD-10-CM

## 2021-08-19 DIAGNOSIS — Z124 Encounter for screening for malignant neoplasm of cervix: Secondary | ICD-10-CM

## 2021-08-19 DIAGNOSIS — Z6841 Body Mass Index (BMI) 40.0 and over, adult: Secondary | ICD-10-CM

## 2021-08-19 DIAGNOSIS — Z0000 Encounter for general adult medical examination without abnormal findings: Secondary | ICD-10-CM

## 2021-08-19 DIAGNOSIS — G8929 Other chronic pain: Secondary | ICD-10-CM

## 2021-08-19 DIAGNOSIS — M25561 Pain in right knee: Secondary | ICD-10-CM

## 2021-08-19 DIAGNOSIS — M25562 Pain in left knee: Secondary | ICD-10-CM

## 2021-08-19 DIAGNOSIS — Z1211 Encounter for screening for malignant neoplasm of colon: Secondary | ICD-10-CM

## 2021-08-19 DIAGNOSIS — Z1231 Encounter for screening mammogram for malignant neoplasm of breast: Secondary | ICD-10-CM

## 2021-08-19 DIAGNOSIS — I10 Essential (primary) hypertension: Secondary | ICD-10-CM

## 2021-08-19 DIAGNOSIS — Z23 Encounter for immunization: Secondary | ICD-10-CM

## 2021-08-19 DIAGNOSIS — M79671 Pain in right foot: Secondary | ICD-10-CM

## 2021-08-19 LAB — POCT WET PREP (WET MOUNT)
Clue Cells Wet Prep Whiff POC: NEGATIVE
Trichomonas Wet Prep HPF POC: ABSENT

## 2021-08-19 MED ORDER — HYDROCHLOROTHIAZIDE 25 MG PO TABS: 25.0000 mg | ORAL_TABLET | Freq: Every day | ORAL | 11 refills | Status: DC

## 2021-08-19 MED ORDER — CALCIUM-MAGNESIUM-VITAMIN D 300-20-200 MG-MG-UNIT PO CHEW: CHEWABLE_TABLET | ORAL | Status: DC

## 2021-08-19 NOTE — Progress Notes (Signed)
Subjective:    Patient ID: Sarah Singh, female   DOB: May 06, 1966, 55 y.o.   MRN: [id number]   HPI  CPE with pap  1.  Pap:  Last pap in 10/2018 and normal.    2.  Mammogram:  Has not gone for mammogram since 2013.  No family history of breast cancer.    3.  Osteoprevention:  Does not use dairy and not physically active.  4.  Guaiac Cards/FIT:  Never.  5.  Colonoscopy:  Never.  No family history of colon cancer.   6.  Immunizations:   Immunization History  Administered Date(s) Administered   Influenza Inj Mdck Quad Pf 10/27/2018   PFIZER(Purple Top)SARS-COV-2 Vaccination 01/15/2020, 02/05/2020   Tdap 07/24/2018   Zoster Recombinat (Shingrix) 07/31/2021     7.  Glucose/Cholesterol:  history of prediabetes and mild hypercholesterolemia.  Has not been checked in 2 years.    Current Meds  Medication Sig   hydrochlorothiazide (HYDRODIURIL) 25 MG tablet Take 1 tablet (25 mg total) by mouth daily.   Multiple Vitamins-Minerals (MULTIVITAMIN ADULTS 50+ PO) Take 1 tablet by mouth daily.   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   Past Medical History:  Diagnosis Date   Arthritis    knees and hands   Essential hypertension 07/25/2018   Prediabetes 12/18/2014   Vitamin D deficiency 2016   Past Surgical History:  Procedure Laterality Date    TM tubes      TUBAL LIGATION  1992   Family History  Problem Relation Age of Onset   Hypertension Mother    Cancer Father    Diabetes Sister    Esophageal cancer Brother    Cancer Brother 9       throat cancer    Colon cancer Neg Hx    Colon polyps Neg Hx    Rectal cancer Neg Hx    Stomach cancer Neg Hx    Social History   Socioeconomic History   Marital status: Single    Spouse name: Long term boyfriend   Number of children: 2   Years of education: 11   Highest education level: Not on file  Occupational History   Occupation: Scientist, water quality   Tobacco Use   Smoking  status: Former    Types: Cigarettes    Start date: 03/08/1980    Quit date: 03/08/2013    Years since quitting: 9.4   Smokeless tobacco: Never  Vaping Use   Vaping Use: Never used  Substance and Sexual Activity   Alcohol use: No    Comment: History of abuse.  Stopped in 2010   Drug use: No    Types: Cocaine    Comment: no use since 2010.    Sexual activity: Yes    Birth control/protection: Surgical  Other Topics Concern   Not on file  Social History Narrative   Lives in an apartment by herself   Working at [employer] Strain: Not on file  Food Insecurity: Not on file  Transportation Needs: Not on file  Physical Activity: Not on file  Stress: Not on file  Social Connections: Not on file  Intimate Partner Violence: Not on file      Review of Systems  Constitutional:        Continues to gain weight.  Eating lots of fried foods.  Works at [employer] and feels she eats healthy there.  Just loves to eat.  Not physically active.  Respiratory:  Negative for shortness of breath.   Cardiovascular:  Negative for chest pain, palpitations and leg swelling.  Musculoskeletal:  Positive for arthralgias (Bilateral knees hurt all the time.  Cooler weather makes it worse.  Using Voltaren cream and helps.).       Bump on plantar aspect of right foot in arch.     Objective:   BP (!) 154/94 (BP Location: Left Arm, Patient Position: Sitting, Cuff Size: Normal)   Pulse 72   Resp 16   Ht 5' 3.75" (1.619 m)   Wt 260 lb (117.9 kg)   LMP  (LMP Unknown)   BMI 44.98 kg/m   Physical Exam Constitutional:      Appearance: She is obese.  HENT:     Head: Normocephalic and atraumatic.     Right Ear: Tympanic membrane, ear canal and external ear normal.     Left Ear: Tympanic membrane, ear canal and external ear normal.     Nose: Nose normal.     Mouth/Throat:     Mouth: Mucous membranes are moist.     Pharynx: Oropharynx is clear.  Eyes:      Extraocular Movements: Extraocular movements intact.     Conjunctiva/sclera: Conjunctivae normal.     Pupils: Pupils are equal, round, and reactive to light.     Comments: Discs sharp  Neck:     Thyroid: No thyroid mass or thyromegaly.  Cardiovascular:     Rate and Rhythm: Normal rate and regular rhythm.     Heart sounds: S1 normal and S2 normal. No murmur heard.    No friction rub. No S3 sounds.     Comments: No carotid bruits.  Carotid, radial, femoral, DP and PT pulses normal and equal.   Pulmonary:     Effort: Pulmonary effort is normal.     Breath sounds: Normal breath sounds and air entry.  Chest:  Breasts:    Right: No inverted nipple, mass or nipple discharge.     Left: No inverted nipple, mass or nipple discharge.  Abdominal:     General: Bowel sounds are normal.     Palpations: Abdomen is soft. There is no hepatomegaly, splenomegaly or mass.     Tenderness: There is no abdominal tenderness.     Hernia: No hernia is present.  Genitourinary:    Comments: Normal external female genitalia No vaginal discharge No uterine or adnexal mass or tenderness. Musculoskeletal:        General: Normal range of motion.     Cervical back: Normal range of motion and neck supple.     Right knee: No effusion or crepitus.     Left knee: No effusion or crepitus.     Right lower leg: No edema.     Left lower leg: No edema.     Comments: Hypertrophic changes to bilateral knees.     Feet:     Comments: Soft tissue mass in arch of right medial foot.  Moveable. Lymphadenopathy:     Head:     Right side of head: No submental or submandibular adenopathy.     Left side of head: No submental or submandibular adenopathy.     Cervical: No cervical adenopathy.     Upper Body:     Right upper body: No supraclavicular or axillary adenopathy.     Left upper body: No supraclavicular or axillary adenopathy.     Lower Body: No right inguinal adenopathy. No  left inguinal adenopathy.  Skin:     General: Skin is warm.     Capillary Refill: Capillary refill takes less than 2 seconds.     Findings: No rash.  Neurological:     General: No focal deficit present.     Mental Status: She is alert and oriented to person, place, and time.     Cranial Nerves: Cranial nerves 2-12 are intact.     Sensory: Sensation is intact.     Motor: Motor function is intact.     Coordination: Coordination is intact.     Gait: Gait is intact.     Deep Tendon Reflexes: Reflexes are normal and symmetric.  Psychiatric:        Attention and Perception: Attention normal.        Speech: Speech normal.        Behavior: Behavior normal. Behavior is cooperative.      Assessment & Plan    CPE with pap Mammogram ordered FIT to return in 2 weeks.   Screening colonoscopy referral Calcium and Vitamin D supplementation. Pfizer COVID vaccine. Fasting labs in 1 week:  FLP, CBC, CMP, A1C, TSH, Hep C, Vit D   2.  Right foot plantar cystic lesion:  podiatry referral  3.  History of GU Chlamydia infection:  GC/chlamydia, wet prep  4.  Hypertension:  not controlled.  Repeat BP with fasting labs in 1 week.    5.  Knee pain;  discussed weight loss with improving diet and physical activity--water exercise at [facility] and recumbent bike.

## 2021-08-19 NOTE — Patient Instructions (Addendum)
[facility] [place], [place] [address]  [phone] Hours of Operation Mondays to Thursdays: 8 am to 8 pm Fridays: 9 am to 8 pm Saturdays: 9 am to 1 pm Sundays: Closed   Drink a glass of water before every meal Drink 6-8 glasses of water daily Eat three meals daily Eat a protein and healthy fat with every meal (eggs,fish, chicken, [place] and limit red meats) Eat 5 servings of vegetables daily, mix the colors Eat 2 servings of fruit daily with skin, if skin is edible Use smaller plates Put food/utensils down as you chew and swallow each bite Eat at a table with friends/family at least once daily, no TV Do not eat in front of the TV  Recent studies show that people who consume all of their calories in a 12 hour period lose weight more efficiently.  For example, if you eat your first meal at 7:00 a.m., your last meal of the day should be completed by 7:00 p.m.

## 2021-08-21 LAB — CYTOLOGY - PAP

## 2021-08-22 LAB — GC/CHLAMYDIA PROBE AMP
Chlamydia trachomatis, NAA: POSITIVE — AB
Neisseria Gonorrhoeae by PCR: NEGATIVE

## 2021-08-24 ENCOUNTER — Encounter (HOSPITAL_BASED_OUTPATIENT_CLINIC_OR_DEPARTMENT_OTHER): Payer: Self-pay | Admitting: Urology

## 2021-08-24 ENCOUNTER — Other Ambulatory Visit: Payer: Self-pay

## 2021-08-24 ENCOUNTER — Emergency Department (HOSPITAL_COMMUNITY)
Admission: EM | Admit: 2021-08-24 | Discharge: 2021-08-24 | Disposition: A | Discharge status: Home / Self Care | Payer: 59 | Attending: Emergency Medicine | Admitting: Emergency Medicine

## 2021-08-24 DIAGNOSIS — Z202 Contact with and (suspected) exposure to infections with a predominantly sexual mode of transmission: Secondary | ICD-10-CM | POA: Insufficient documentation

## 2021-08-24 DIAGNOSIS — I10 Essential (primary) hypertension: Secondary | ICD-10-CM | POA: Insufficient documentation

## 2021-08-24 DIAGNOSIS — Z79899 Other long term (current) drug therapy: Secondary | ICD-10-CM | POA: Diagnosis not present

## 2021-08-24 DIAGNOSIS — Z87891 Personal history of nicotine dependence: Secondary | ICD-10-CM | POA: Insufficient documentation

## 2021-08-24 DIAGNOSIS — A749 Chlamydial infection, unspecified: Secondary | ICD-10-CM | POA: Insufficient documentation

## 2021-08-24 DIAGNOSIS — Z5321 Procedure and treatment not carried out due to patient leaving prior to being seen by health care provider: Secondary | ICD-10-CM | POA: Insufficient documentation

## 2021-08-24 DIAGNOSIS — Z113 Encounter for screening for infections with a predominantly sexual mode of transmission: Secondary | ICD-10-CM | POA: Insufficient documentation

## 2021-08-24 NOTE — ED Triage Notes (Signed)
States sexual partner tested positive for chlamydia, pt not having any symptoms at this time

## 2021-08-25 ENCOUNTER — Emergency Department (HOSPITAL_BASED_OUTPATIENT_CLINIC_OR_DEPARTMENT_OTHER)
Admission: EM | Admit: 2021-08-25 | Discharge: 2021-08-25 | Disposition: A | Discharge status: Home / Self Care | Payer: 59 | Attending: Emergency Medicine | Admitting: Emergency Medicine

## 2021-08-25 DIAGNOSIS — A749 Chlamydial infection, unspecified: Secondary | ICD-10-CM

## 2021-08-25 MED ORDER — DOXYCYCLINE HYCLATE 100 MG PO TABS
100.0000 mg | ORAL_TABLET | Freq: Once | ORAL | Status: AC
  Administered 2021-08-25: 100 mg via ORAL
  Filled 2021-08-25: qty 1

## 2021-08-25 MED ORDER — DOXYCYCLINE HYCLATE 100 MG PO CAPS: 100.0000 mg | ORAL_CAPSULE | Freq: Two times a day (BID) | ORAL | 0 refills | Status: DC

## 2021-08-25 NOTE — ED Provider Notes (Signed)
[facility] Provider Note: Georgena Spurling, MD, FACEP  CSN: [id number] MRN: [id number] ARRIVAL: 08/24/21 at 2306 ROOM: [place]  STD Exposure   HISTORY OF PRESENT ILLNESS  08/25/21 3:16 AM Sarah Singh is a 55 y.o. female who states her sexual partner tested positive for chlamydia.  She was seen at her OB/GYN on 08/19/2021 and tested positive for chlamydia as well.  She has not been treated for this.  She is asymptomatic.  She denies vaginal bleeding or discharge.  She denies abdominal pain.   Past Medical History:  Diagnosis Date   Essential hypertension 07/25/2018   Prediabetes 12/18/2014   Vitamin D deficiency 2016    Past Surgical History:  Procedure Laterality Date    TM tubes      TUBAL LIGATION  1992    Family History  Problem Relation Age of Onset   Hypertension Mother    Diabetes Sister    Cancer Brother 5       throat cancer    Cancer Father     Social History   Tobacco Use   Smoking status: Former    Types: Cigarettes    Start date: 03/08/1980    Quit date: 03/08/2013    Years since quitting: 8.4   Smokeless tobacco: Never  Vaping Use   Vaping Use: Never used  Substance Use Topics   Alcohol use: No    Comment: History of abuse.  Stopped in 2010   Drug use: No    Types: Cocaine    Comment: no use since 2010.     Prior to Admission medications   Medication Sig Start Date End Date Taking? Authorizing Provider  doxycycline (VIBRAMYCIN) 100 MG capsule Take 1 capsule (100 mg total) by mouth 2 (two) times daily. One po bid x 7 days 08/25/21  Yes Madalee Altmann, MD  Calcium-Magnesium-Vitamin D 300-20-200 MG-MG-UNIT CHEW 2 chews by mouth twice daily. 08/19/21   Mack Hook, MD  hydrochlorothiazide (HYDRODIURIL) 25 MG tablet Take 1 tablet (25 mg total) by mouth daily. 08/19/21   Mack Hook, MD  Multiple Vitamins-Minerals (MULTIVITAMIN ADULTS 50+ PO) Take 1 tablet by mouth daily.    [provider]     Allergies Other   REVIEW OF SYSTEMS  Negative except as noted here or in the History of Present Illness.   PHYSICAL EXAMINATION  Initial Vital Signs Blood pressure (!) 149/79, pulse 87, temperature 98.7 F (37.1 C), temperature source Oral, resp. rate 20, height 5\' 3"  (1.6 m), weight 117.9 kg, SpO2 100 %.  Examination General: Well-developed, well-nourished female in no acute distress; appearance consistent with age of record HENT: normocephalic; atraumatic Eyes: Normal appearance Neck: supple Heart: regular rate and rhythm Lungs: clear to auscultation bilaterally Abdomen: soft; nondistended; nontender; bowel sounds present Extremities: No deformity; full range of motion Neurologic: Awake, alert and oriented; motor function intact in all extremities and symmetric; no facial droop Skin: Warm and dry Psychiatric: Normal mood and affect   RESULTS  Summary of this visit's results, reviewed and interpreted by myself:   EKG Interpretation  Date/Time:    Ventricular Rate:    PR Interval:    QRS Duration:   QT Interval:    QTC Calculation:   R Axis:     Text Interpretation:         Laboratory Studies: No results found for this or any previous visit (from the past 24 hour(s)). Imaging Studies: No results found.  ED COURSE and MDM  Nursing notes, initial and subsequent vitals signs, including pulse oximetry, reviewed and interpreted by myself.  Vitals:   08/24/21 2317  BP: (!) 149/79  Pulse: 87  Resp: 20  Temp: 98.7 F (37.1 C)  TempSrc: Oral  SpO2: 100%  Weight: 117.9 kg  Height: 5\' 3"  (1.6 m)   Medications  doxycycline (VIBRA-TABS) tablet 100 mg (has no administration in time range)    We will treat with doxycycline 100 mg twice daily for 1 week which is the currently preferred regimen for chlamydia.  Her gonorrhea test was negative.  PROCEDURES  Procedures   ED DIAGNOSES     ICD-10-CM   1. Chlamydia  A74.9          Eular Panek,  MD 08/25/21 [phone]

## 2021-08-26 ENCOUNTER — Other Ambulatory Visit (INDEPENDENT_AMBULATORY_CARE_PROVIDER_SITE_OTHER): Payer: Self-pay

## 2021-08-26 VITALS — BP 152/90

## 2021-08-26 DIAGNOSIS — R7303 Prediabetes: Secondary | ICD-10-CM | POA: Diagnosis not present

## 2021-08-26 DIAGNOSIS — Z0130 Encounter for examination of blood pressure without abnormal findings: Secondary | ICD-10-CM

## 2021-08-26 DIAGNOSIS — Z79899 Other long term (current) drug therapy: Secondary | ICD-10-CM

## 2021-08-26 DIAGNOSIS — Z13220 Encounter for screening for lipoid disorders: Secondary | ICD-10-CM | POA: Diagnosis not present

## 2021-08-26 DIAGNOSIS — Z0000 Encounter for general adult medical examination without abnormal findings: Secondary | ICD-10-CM

## 2021-08-26 DIAGNOSIS — Z1159 Encounter for screening for other viral diseases: Secondary | ICD-10-CM

## 2021-08-26 NOTE — Progress Notes (Signed)
Pt reported that she takes medication consistently. Took bp medication this morning.   After speaking with Dr. Amil Amen, lisinopril 10mg  once daily was sent and patient was scheduled for a BMP and bp check in 3 weeks

## 2021-08-27 ENCOUNTER — Telehealth: Payer: Self-pay

## 2021-08-27 LAB — LIPID PANEL W/O CHOL/HDL RATIO
Cholesterol, Total: 205 mg/dL — ABNORMAL HIGH (ref 100–199)
HDL: 50 mg/dL (ref >39)
LDL Chol Calc (NIH): 141 mg/dL — ABNORMAL HIGH (ref 0–99)
Triglycerides: 78 mg/dL (ref 0–149)
VLDL Cholesterol Cal: 14 mg/dL (ref 5–40)

## 2021-08-27 LAB — COMPREHENSIVE METABOLIC PANEL
ALT: 19 IU/L (ref 0–32)
AST: 19 IU/L (ref 0–40)
Albumin/Globulin Ratio: 1.2 (ref 1.2–2.2)
Albumin: 4.1 g/dL (ref 3.8–4.9)
Alkaline Phosphatase: 92 IU/L (ref 44–121)
BUN/Creatinine Ratio: 28 — ABNORMAL HIGH (ref 9–23)
BUN: 16 mg/dL (ref 6–24)
Bilirubin Total: 0.3 mg/dL (ref 0.0–1.2)
CO2: 24 mmol/L (ref 20–29)
Calcium: 9.2 mg/dL (ref 8.7–10.2)
Chloride: 103 mmol/L (ref 96–106)
Creatinine, Ser: 0.57 mg/dL (ref 0.57–1.00)
Globulin, Total: 3.5 g/dL (ref 1.5–4.5)
Glucose: 91 mg/dL (ref 70–99)
Potassium: 3.8 mmol/L (ref 3.5–5.2)
Sodium: 142 mmol/L (ref 134–144)
Total Protein: 7.6 g/dL (ref 6.0–8.5)
eGFR: 108 mL/min/{1.73_m2} (ref >59)

## 2021-08-27 LAB — TSH: TSH: 2.25 u[IU]/mL (ref 0.450–4.500)

## 2021-08-27 LAB — CBC WITH DIFFERENTIAL/PLATELET
Basophils Absolute: 0.1 10*3/uL (ref 0.0–0.2)
Basos: 1 %
EOS (ABSOLUTE): 0.3 10*3/uL (ref 0.0–0.4)
Eos: 3 %
Hematocrit: 38.7 % (ref 34.0–46.6)
Hemoglobin: 12.8 g/dL (ref 11.1–15.9)
Immature Grans (Abs): 0 10*3/uL (ref 0.0–0.1)
Immature Granulocytes: 0 %
Lymphocytes Absolute: 3.5 10*3/uL — ABNORMAL HIGH (ref 0.7–3.1)
Lymphs: 39 %
MCH: 30 pg (ref 26.6–33.0)
MCHC: 33.1 g/dL (ref 31.5–35.7)
MCV: 91 fL (ref 79–97)
Monocytes Absolute: 0.5 10*3/uL (ref 0.1–0.9)
Monocytes: 6 %
Neutrophils Absolute: 4.6 10*3/uL (ref 1.4–7.0)
Neutrophils: 51 %
Platelets: 331 10*3/uL (ref 150–450)
RBC: 4.27 x10E6/uL (ref 3.77–5.28)
RDW: 13 % (ref 11.7–15.4)
WBC: 8.9 10*3/uL (ref 3.4–10.8)

## 2021-08-27 LAB — VITAMIN D 25 HYDROXY (VIT D DEFICIENCY, FRACTURES): Vit D, 25-Hydroxy: 24.9 ng/mL — ABNORMAL LOW (ref 30.0–100.0)

## 2021-08-27 LAB — HEMOGLOBIN A1C
Est. average glucose Bld gHb Est-mCnc: 137 mg/dL
Hgb A1c MFr Bld: 6.4 % — ABNORMAL HIGH (ref 4.8–5.6)

## 2021-08-27 LAB — HEPATITIS C ANTIBODY: Hep C Virus Ab: <0.1 s/co ratio (ref 0.0–0.9)

## 2021-08-27 MED ORDER — LISINOPRIL 10 MG PO TABS: 10.0000 mg | ORAL_TABLET | Freq: Every day | ORAL | 3 refills | Status: DC

## 2021-08-27 NOTE — Telephone Encounter (Signed)
Spoke with Dr Amil Amen and will tell pt to get retested in 3 months

## 2021-08-27 NOTE — Telephone Encounter (Signed)
Pt recently tested positive for Chlamydia and was given doxycyline at the hospital. Medication ends Monday 10/31. Pt would like to retest for Chlamydia after completing medication

## 2021-08-28 NOTE — Telephone Encounter (Signed)
Pt.notified

## 2021-08-28 NOTE — Telephone Encounter (Signed)
Pt. Coming in next week for labs and will get schedule for clearance of chlamydia when in

## 2021-09-01 ENCOUNTER — Telehealth: Payer: Self-pay

## 2021-09-01 NOTE — Telephone Encounter (Signed)
Pt was called to schedule mammogram and was told that they do not accept her insurance. Pt would like to be referred somewhere different

## 2021-09-01 NOTE — Telephone Encounter (Signed)
See if [facility] accepts her insurance.

## 2021-09-02 NOTE — Telephone Encounter (Signed)
[facility] does not take [employer]. I looked at [employer] in-network options for mammogram and the closest was: [place] at [facility]

## 2021-09-03 ENCOUNTER — Other Ambulatory Visit: Payer: Self-pay

## 2021-09-03 ENCOUNTER — Ambulatory Visit (HOSPITAL_COMMUNITY)
Admission: EM | Admit: 2021-09-03 | Discharge: 2021-09-03 | Disposition: A | Discharge status: Home / Self Care | Payer: 59 | Attending: Emergency Medicine | Admitting: Emergency Medicine

## 2021-09-03 ENCOUNTER — Ambulatory Visit: Payer: 59 | Admitting: Sports Medicine

## 2021-09-03 ENCOUNTER — Encounter (HOSPITAL_COMMUNITY): Payer: Self-pay

## 2021-09-03 DIAGNOSIS — N939 Abnormal uterine and vaginal bleeding, unspecified: Secondary | ICD-10-CM | POA: Diagnosis present

## 2021-09-03 LAB — POCT URINALYSIS DIPSTICK, ED / UC
Bilirubin Urine: NEGATIVE
Glucose, UA: NEGATIVE mg/dL
Hgb urine dipstick: NEGATIVE
Ketones, ur: NEGATIVE mg/dL
Nitrite: NEGATIVE
Protein, ur: NEGATIVE mg/dL
Specific Gravity, Urine: 1.025 (ref 1.005–1.030)
Urobilinogen, UA: 1 mg/dL (ref 0.0–1.0)
pH: 5.5 (ref 5.0–8.0)

## 2021-09-03 NOTE — Discharge Instructions (Signed)
We will contact you if the results from your lab work are positive and require additional treatment.    Do not have sex while waiting for results or while undergoing treatment for STI.  Make sure that all of your partners get tested and treated.  Use a condom or other barrier method for all sexual encounters.    Return or go to the Emergency Department if symptoms worsen or do not improve in the next few days.  Follow up with an OB/GYN for further evaluation of vaginal bleeding.

## 2021-09-03 NOTE — Telephone Encounter (Signed)
Confirmed with Pt that she is able and willing to go to the [place] location

## 2021-09-03 NOTE — ED Triage Notes (Signed)
Pt presents with complaints of mild vaginal bleeding. Patient is concerned for chlamydia.

## 2021-09-03 NOTE — ED Provider Notes (Signed)
[place]    CSN: [id number] Arrival date & time: 09/03/21  1014      History   Chief Complaint Chief Complaint  Patient presents with   Vaginal Bleeding    HPI Sarah Singh is a 55 y.o. female.   Patient here for evaluation of vaginal spotting that has been ongoing for about 1 week.  Also reports urinary frequency and possible vaginal discharge.  Reports that she googled her symptoms and is now concerned about chlamydia.  Patient reports that she is in a monogamous relationship.  Has not taken any OTC medications or treatments.  Reports that she has not had any vaginal bleeding for several years.  Denies any trauma, injury, or other precipitating event.  Denies any specific alleviating or aggravating factors.  Denies any fevers, chest pain, shortness of breath, N/V/D, numbness, tingling, weakness, abdominal pain, or headaches.    The history is provided by the patient.  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 Authorizing Provider  Calcium-Magnesium-Vitamin D 300-20-200 MG-MG-UNIT CHEW 2 chews by mouth twice daily. 08/19/21   Mack Hook, MD  doxycycline (VIBRAMYCIN) 100 MG capsule Take 1 capsule (100 mg total) by mouth 2 (two) times daily. One po bid x 7 days 08/25/21   Molpus, John, MD  hydrochlorothiazide (HYDRODIURIL) 25 MG tablet Take 1 tablet (25 mg total) by mouth daily. 08/19/21   Mack Hook, MD  lisinopril (ZESTRIL) 10 MG tablet Take 1 tablet (10 mg total) by mouth daily. 08/27/21   Mack Hook, MD  Multiple Vitamins-Minerals (MULTIVITAMIN ADULTS 50+ PO) Take 1 tablet by mouth daily.    [provider]    Family History Family History  Problem Relation Age of Onset   Hypertension Mother    Diabetes Sister    Cancer Brother 15       throat cancer    Cancer Father     Social History Social History   Tobacco Use   Smoking status: Former    Types: Cigarettes  Start date: 03/08/1980    Quit date: 03/08/2013    Years since quitting: 8.4   Smokeless tobacco: Never  Vaping Use   Vaping Use: Never used  Substance Use Topics   Alcohol use: No    Comment: History of abuse.  Stopped in 2010   Drug use: No    Types: Cocaine    Comment: no use since 2010.      Allergies   Other   Review of Systems Review of Systems  Genitourinary:  Positive for frequency, vaginal bleeding and vaginal discharge.  All other systems reviewed and are negative.   Physical Exam Triage Vital Signs ED Triage Vitals  Enc Vitals Group     BP 09/03/21 1218 133/80     Pulse Rate 09/03/21 1218 67     Resp 09/03/21 1218 19     Temp 09/03/21 1218 98.1 F (36.7 C)     Temp src --      SpO2 09/03/21 1218 100 %     Weight --      Height --      Head Circumference --      Peak Flow --      Pain Score 09/03/21 1217 0     Pain Loc --       Pain Edu? --      Excl. in [place]? --    No data found.  Updated Vital Signs BP 133/80   Pulse 67   Temp 98.1 F (36.7 C)   Resp 19   LMP  (LMP Unknown)   SpO2 100%   Visual Acuity Right Eye Distance:   Left Eye Distance:   Bilateral Distance:    Right Eye Near:   Left Eye Near:    Bilateral Near:     Physical Exam Vitals and nursing note reviewed.  Constitutional:      General: She is not in acute distress.    Appearance: Normal appearance. She is not ill-appearing, toxic-appearing or diaphoretic.  HENT:     Head: Normocephalic and atraumatic.  Eyes:     Conjunctiva/sclera: Conjunctivae normal.  Cardiovascular:     Rate and Rhythm: Normal rate.     Pulses: Normal pulses.  Pulmonary:     Effort: Pulmonary effort is normal.  Abdominal:     General: Abdomen is flat.  Genitourinary:    Comments: declines Musculoskeletal:        General: Normal range of motion.     Cervical back: Normal range of motion.  Skin:    General: Skin is warm and dry.  Neurological:     General: No focal deficit present.     Mental Status: She is alert and oriented to person, place, and time.  Psychiatric:        Mood and Affect: Mood normal.     UC Treatments / Results  Labs (all labs ordered are listed, but only abnormal results are displayed) Labs Reviewed  POCT URINALYSIS DIPSTICK, ED / UC - Abnormal; Notable for the following components:      Result Value   Leukocytes,Ua SMALL (*)    All other components within normal limits  URINE CULTURE  CERVICOVAGINAL ANCILLARY ONLY    EKG   Radiology No results found.  Procedures Procedures (including critical care time)  Medications Ordered in UC Medications - No data to display  Initial Impression / Assessment and Plan / UC Course  I have reviewed the triage vital signs and the nursing notes.  Pertinent labs &  imaging results that were available during my care of the patient were reviewed by me and considered in my  medical decision making (see chart for details).    Assessment negative for red flags or concerns.  Urinalysis positive for leukocytes but otherwise negative.  Will obtain urine culture to check for any UTI due to urinary frequency.  Self swab obtained and will treat based on results.  Discussed the patient needs to abstain from sexual activity while waiting for results and undergoing treatment for STIs.  Recommend following up with OB/GYN for reevaluation if vaginal bleeding or spotting continues. Final Clinical Impressions(s) / UC Diagnoses   Final diagnoses:  Abnormal uterine bleeding     Discharge Instructions      We will contact you if the results from your lab work are positive and require additional treatment.    Do not have sex while waiting for results or while undergoing treatment for STI.  Make sure that all of your partners get tested and treated.  Use a condom or other barrier method for all sexual encounters.    Return or go to the Emergency Department if symptoms worsen or do not improve in the next few days.  Follow up with an OB/GYN for further evaluation of vaginal bleeding.      ED Prescriptions   None    PDMP not reviewed this encounter.   Pearson Forster, NP 09/03/21 1249

## 2021-09-04 ENCOUNTER — Telehealth (HOSPITAL_COMMUNITY): Payer: Self-pay | Admitting: Emergency Medicine

## 2021-09-04 LAB — CERVICOVAGINAL ANCILLARY ONLY
Bacterial Vaginitis (gardnerella): NEGATIVE
Candida Glabrata: NEGATIVE
Candida Vaginitis: POSITIVE — AB
Chlamydia: NEGATIVE
Comment: NEGATIVE
Comment: NEGATIVE
Comment: NEGATIVE
Comment: NEGATIVE
Comment: NEGATIVE
Comment: NORMAL
Neisseria Gonorrhea: NEGATIVE
Trichomonas: POSITIVE — AB

## 2021-09-04 LAB — URINE CULTURE

## 2021-09-04 MED ORDER — METRONIDAZOLE 500 MG PO TABS: 500.0000 mg | ORAL_TABLET | Freq: Two times a day (BID) | ORAL | 0 refills | Status: DC

## 2021-09-04 MED ORDER — FLUCONAZOLE 150 MG PO TABS: 150.0000 mg | ORAL_TABLET | Freq: Once | ORAL | 0 refills | Status: AC

## 2021-09-15 ENCOUNTER — Other Ambulatory Visit: Payer: Self-pay

## 2021-09-15 ENCOUNTER — Ambulatory Visit (HOSPITAL_COMMUNITY)
Admission: EM | Admit: 2021-09-15 | Discharge: 2021-09-15 | Disposition: A | Discharge status: Home / Self Care | Payer: 59 | Attending: Student | Admitting: Student

## 2021-09-15 ENCOUNTER — Encounter (HOSPITAL_COMMUNITY): Payer: Self-pay | Admitting: Emergency Medicine

## 2021-09-15 DIAGNOSIS — Z113 Encounter for screening for infections with a predominantly sexual mode of transmission: Secondary | ICD-10-CM | POA: Insufficient documentation

## 2021-09-15 DIAGNOSIS — R102 Pelvic and perineal pain: Secondary | ICD-10-CM

## 2021-09-15 NOTE — Discharge Instructions (Addendum)
-  We have sent testing for sexually transmitted infections. We will notify you of any positive results once they are received. If required, we will prescribe any medications you might need. Please refrain from all sexual activity until treatment is complete.  -Seek additional medical attention if you develop fevers/chills, new/worsening abdominal pain, new/worsening vaginal discomfort/discharge, etc.   

## 2021-09-15 NOTE — ED Triage Notes (Signed)
Pt is present today with pelvic pain. Pt states pain started this morning. Pt would also like STD testing

## 2021-09-15 NOTE — ED Provider Notes (Signed)
[facility]    CSN: [id number] Arrival date & time: 09/15/21  1844      History   Chief Complaint Chief Complaint  Patient presents with   Pelvic Pain    HPI Sarah Singh is a 55 y.o. female presenting with lower abdominal pain for about 2 days.  Medical history noncontributory.  Denies new partners, but requests STI screening.  She states that she has "been putting too many things in my coochy." Describes this as crampy and under her pannus. Denies pelvic pain, vaginitis. Denies hematuria, dysuria, frequency, urgency, back pain, n/v/d/abd pain, fevers/chills, abdnormal vaginal discharge.    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 Authorizing Provider  Calcium-Magnesium-Vitamin D 300-20-200 MG-MG-UNIT CHEW 2 chews by mouth twice daily. 08/19/21   Mack Hook, MD  doxycycline (VIBRAMYCIN) 100 MG capsule Take 1 capsule (100 mg  total) by mouth 2 (two) times daily. One po bid x 7 days 08/25/21   Molpus, John, MD  hydrochlorothiazide (HYDRODIURIL) 25 MG tablet Take 1 tablet (25 mg total) by mouth daily. 08/19/21   Mack Hook, MD  lisinopril (ZESTRIL) 10 MG tablet Take 1 tablet (10 mg total) by mouth daily. 08/27/21   Mack Hook, MD  metroNIDAZOLE (FLAGYL) 500 MG tablet Take 1 tablet (500 mg total) by mouth 2 (two) times daily. 09/04/21   LampteyMyrene Galas, MD  Multiple Vitamins-Minerals (MULTIVITAMIN ADULTS 50+ PO) Take 1 tablet by mouth daily.    [provider]    Family History Family History  Problem Relation Age of Onset   Hypertension Mother    Diabetes Sister    Cancer Brother 51       throat cancer    Cancer Father     Social History Social History   Tobacco Use   Smoking status: Former    Types: Cigarettes    Start date: 03/08/1980    Quit date: 03/08/2013    Years since quitting: 8.5   Smokeless tobacco: Never  Vaping Use   Vaping Use: Never used  Substance  Use Topics   Alcohol use: No    Comment: History of abuse.  Stopped in 2010   Drug use: No    Types: Cocaine    Comment: no use since 2010.      Allergies   Other   Review of Systems Review of Systems  Constitutional:  Negative for chills and fever.  HENT:  Negative for sore throat.   Eyes:  Negative for pain and redness.  Respiratory:  Negative for shortness of breath.   Cardiovascular:  Negative for chest pain.  Gastrointestinal:  Positive for abdominal pain. Negative for diarrhea, nausea and vomiting.  Genitourinary:  Negative for decreased urine volume, difficulty urinating, dysuria, flank pain, frequency, genital sores, hematuria and urgency.  Musculoskeletal:  Negative for back pain.  Skin:  Negative for rash.  All other systems reviewed and are negative.   Physical Exam Triage Vital Signs ED Triage Vitals  Enc Vitals Group     BP 09/15/21 1949 124/74     Pulse Rate 09/15/21 1949 88      Resp 09/15/21 1949 18     Temp 09/15/21 1949 97.8 F (36.6 C)     Temp src --      SpO2 09/15/21 1949 100 %     Weight --      Height --      Head Circumference --      Peak Flow --      Pain Score 09/15/21 1946 7     Pain Loc --      Pain Edu? --      Excl. in [place]? --    No data found.  Updated Vital Signs BP 124/74   Pulse 88   Temp 97.8 F (36.6 C)   Resp 18   LMP  (LMP Unknown)   SpO2 100%   Visual Acuity Right Eye Distance:   Left Eye Distance:   Bilateral Distance:    Right Eye Near:   Left Eye Near:    Bilateral Near:     Physical Exam Vitals reviewed.  Constitutional:      General: She is not in acute distress.    Appearance: Normal appearance. She is obese. She is not ill-appearing.  HENT:     Head: Normocephalic and atraumatic.     Mouth/Throat:     Mouth: Mucous membranes are moist.     Comments: Moist mucous membranes Eyes:     Extraocular Movements: Extraocular movements intact.     Pupils: Pupils are equal, round, and reactive to light.  Cardiovascular:     Rate and Rhythm: Normal rate and regular rhythm.     Heart sounds: Normal heart sounds.  Pulmonary:     Effort: Pulmonary effort is normal.     Breath sounds: Normal breath sounds. No wheezing, rhonchi or rales.  Abdominal:     General: Bowel sounds are normal. There is no distension.     Palpations: Abdomen is soft. There is no mass.     Tenderness: There is no abdominal tenderness. There is no right CVA tenderness, left CVA tenderness, guarding or rebound.     Comments: Exam limited due to body habitus No reproducible pain  Skin:    General: Skin is warm.     Capillary Refill: Capillary refill takes less than 2 seconds.     Comments: Good skin turgor  Neurological:     General: No focal deficit present.     Mental Status: She is alert and oriented to  person, place, and time.  Psychiatric:        Mood and Affect: Mood normal.        Behavior: Behavior normal.     UC Treatments /  Results  Labs (all labs ordered are listed, but only abnormal results are displayed) Labs Reviewed  CERVICOVAGINAL ANCILLARY ONLY    EKG   Radiology No results found.  Procedures Procedures (including critical care time)  Medications Ordered in UC Medications - No data to display  Initial Impression / Assessment and Plan / UC Course  I have reviewed the triage vital signs and the nursing notes.  Pertinent labs & imaging results that were available during my care of the patient were reviewed by me and considered in my medical decision making (see chart for details).     This patient is a very pleasant 55 y.o. year old female presenting with pelvic discomfort. Afebrile, nontachycardic, no reproducible abd pain or CVAT.  Denies new partners. Will send self-swab for G/C, trich, yeast, BV testing. Declines HIV, RPR. Safe sex precautions.  She is postmenopausal.  She prefers to wait to treat.   ED return precautions discussed. Patient verbalizes understanding and agreement.    Final Clinical Impressions(s) / UC Diagnoses   Final diagnoses:  Pelvic pain in female     Discharge Instructions      -We have sent testing for sexually transmitted infections. We will notify you of any positive results once they are received. If required, we will prescribe any medications you might need. Please refrain from all sexual activity until treatment is complete.  -Seek additional medical attention if you develop fevers/chills, new/worsening abdominal pain, new/worsening vaginal discomfort/discharge, etc.     ED Prescriptions   None    PDMP not reviewed this encounter.   Hazel Sams, PA-C 09/15/21 2006

## 2021-09-16 LAB — CERVICOVAGINAL ANCILLARY ONLY
Bacterial Vaginitis (gardnerella): NEGATIVE
Candida Glabrata: NEGATIVE
Candida Vaginitis: NEGATIVE
Chlamydia: NEGATIVE
Comment: NEGATIVE
Comment: NEGATIVE
Comment: NEGATIVE
Comment: NEGATIVE
Comment: NEGATIVE
Comment: NORMAL
Neisseria Gonorrhea: NEGATIVE
Trichomonas: NEGATIVE

## 2021-09-17 ENCOUNTER — Other Ambulatory Visit (INDEPENDENT_AMBULATORY_CARE_PROVIDER_SITE_OTHER): Payer: Self-pay

## 2021-09-17 ENCOUNTER — Other Ambulatory Visit: Payer: Self-pay

## 2021-09-17 VITALS — BP 124/88 | HR 68

## 2021-09-17 DIAGNOSIS — Z79899 Other long term (current) drug therapy: Secondary | ICD-10-CM | POA: Diagnosis not present

## 2021-09-17 DIAGNOSIS — Z0130 Encounter for examination of blood pressure without abnormal findings: Secondary | ICD-10-CM | POA: Diagnosis not present

## 2021-09-17 NOTE — Progress Notes (Signed)
Patient reported that she has been taking Bp medication consistently.  Notified Dr Amil Amen of bp, no changes to Rx

## 2021-09-18 LAB — BASIC METABOLIC PANEL
BUN/Creatinine Ratio: 22 (ref 9–23)
BUN: 15 mg/dL (ref 6–24)
CO2: 17 mmol/L — ABNORMAL LOW (ref 20–29)
Calcium: 10.1 mg/dL (ref 8.7–10.2)
Chloride: 104 mmol/L (ref 96–106)
Creatinine, Ser: 0.69 mg/dL (ref 0.57–1.00)
Glucose: 110 mg/dL — ABNORMAL HIGH (ref 70–99)
Potassium: 4.5 mmol/L (ref 3.5–5.2)
Sodium: 143 mmol/L (ref 134–144)
eGFR: 103 mL/min/{1.73_m2} (ref >59)

## 2021-09-22 ENCOUNTER — Encounter: Payer: Self-pay | Admitting: Gastroenterology

## 2021-09-23 ENCOUNTER — Other Ambulatory Visit: Payer: Self-pay

## 2021-09-23 ENCOUNTER — Ambulatory Visit (INDEPENDENT_AMBULATORY_CARE_PROVIDER_SITE_OTHER): Payer: 59

## 2021-09-23 DIAGNOSIS — Z1231 Encounter for screening mammogram for malignant neoplasm of breast: Secondary | ICD-10-CM | POA: Diagnosis not present

## 2021-10-15 ENCOUNTER — Other Ambulatory Visit: Payer: Self-pay

## 2021-10-15 ENCOUNTER — Ambulatory Visit (INDEPENDENT_AMBULATORY_CARE_PROVIDER_SITE_OTHER): Payer: 59 | Admitting: Sports Medicine

## 2021-10-15 ENCOUNTER — Ambulatory Visit (INDEPENDENT_AMBULATORY_CARE_PROVIDER_SITE_OTHER): Payer: 59

## 2021-10-15 DIAGNOSIS — M722 Plantar fascial fibromatosis: Secondary | ICD-10-CM

## 2021-10-15 DIAGNOSIS — M79671 Pain in right foot: Secondary | ICD-10-CM

## 2021-10-15 MED ORDER — TRIAMCINOLONE ACETONIDE 0.5 % EX OINT: 1.0000 "application " | TOPICAL_OINTMENT | Freq: Every day | CUTANEOUS | 1 refills | Status: DC

## 2021-10-15 NOTE — Progress Notes (Signed)
Subjective: Sarah Singh is a 55 y.o. female patient presents to office with complaint of knot in the right arch. States that it has went down and sometimes gives her pain. Admits that its been there for a while now. Denies any trauma or injury.  No other pedal complaints noted.   Current Outpatient Medications on File Prior to Visit  Medication Sig Dispense Refill   baclofen (LIORESAL) 10 MG tablet Take 10 mg by mouth daily.     Calcium-Magnesium-Vitamin D 300-20-200 MG-MG-UNIT CHEW 2 chews by mouth twice daily.     doxycycline (VIBRAMYCIN) 100 MG capsule Take 1 capsule (100 mg total) by mouth 2 (two) times daily. One po bid x 7 days 14 capsule 0   fluconazole (DIFLUCAN) 150 MG tablet Take by mouth.     hydrochlorothiazide (HYDRODIURIL) 25 MG tablet Take 1 tablet (25 mg total) by mouth daily. 30 tablet 11   lisinopril (ZESTRIL) 10 MG tablet Take 1 tablet (10 mg total) by mouth daily. 90 tablet 3   metroNIDAZOLE (FLAGYL) 500 MG tablet Take 1 tablet (500 mg total) by mouth 2 (two) times daily. 14 tablet 0   Multiple Vitamins-Minerals (MULTIVITAMIN ADULTS 50+ PO) Take 1 tablet by mouth daily.     penicillin v potassium (VEETID) 500 MG tablet Take 500 mg by mouth 4 (four) times daily.     No current facility-administered medications on file prior to visit.    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  Dermatology: Skin is warm, dry and supple bilateral lower extremities. Raised soft tissue mass that is supple in the right arch that measures 2x1.5 non pusutale consistent with fibroma. Nails 1-10 are normal. There is no erythema, edema, no eccymosis, no open lesions present. Integument is otherwise unremarkable.  Vascular: Dorsalis Pedis pulse and Posterior Tibial pulse are 2/4 bilateral. Capillary fill time is immediate to all  digits.  Neurological: Grossly intact to light touch bilateral.  Musculoskeletal: Minimal tenderness to palpation at the course of the plantar fascia on the Right foot. + pes planus. Limited ankle ROM. All other joints range of motion within normal limits bilateral. Strength 5/5 in all groups bilateral.   Gait: Unassisted  Xray, Right foot: Normal osseous mineralization. Joint spaces preserved. No fracture/dislocation/boney destruction. + Calcaneal spur present with mild thickening of plantar fascia. No other soft tissue abnormalities or radiopaque foreign bodies.   Assessment and Plan: Problem List Items Addressed This Visit       Other   Right foot pain - Primary   Relevant Orders   DG Foot Complete Right   Other Visit Diagnoses     Plantar fibromatosis           -Complete examination performed.  -Xrays reviewed -Discussed with patient in detail the condition of plantar fibroma -Rx topical kenalog -Explained and dispensed to patient daily stretching exercises. -Recommend patient to ice affected area 1-2x daily. -Patient to return to office PRN or sooner if problems or questions arise.  Landis Martins, DPM

## 2021-10-16 ENCOUNTER — Other Ambulatory Visit: Payer: Self-pay | Admitting: Sports Medicine

## 2021-10-16 DIAGNOSIS — M722 Plantar fascial fibromatosis: Secondary | ICD-10-CM

## 2021-10-27 ENCOUNTER — Telehealth: Payer: Self-pay

## 2021-10-27 NOTE — Telephone Encounter (Signed)
Pt called asking for recommendation for dry mouth she has been experiencing in the mornings. She said it started a week ago. Has had the issue in the past. Has been drinking a lot of water to try to relieve symptom. Was unable to get more details as she had to hang up, she called while working

## 2021-10-31 ENCOUNTER — Other Ambulatory Visit: Payer: Self-pay

## 2021-10-31 ENCOUNTER — Encounter (HOSPITAL_COMMUNITY): Payer: Self-pay | Admitting: *Deleted

## 2021-10-31 ENCOUNTER — Ambulatory Visit (HOSPITAL_COMMUNITY)
Admission: EM | Admit: 2021-10-31 | Discharge: 2021-10-31 | Disposition: A | Discharge status: Home / Self Care | Payer: 59 | Attending: Physician Assistant | Admitting: Physician Assistant

## 2021-10-31 DIAGNOSIS — K047 Periapical abscess without sinus: Secondary | ICD-10-CM

## 2021-10-31 DIAGNOSIS — K0889 Other specified disorders of teeth and supporting structures: Secondary | ICD-10-CM | POA: Diagnosis not present

## 2021-10-31 MED ORDER — AMOXICILLIN-POT CLAVULANATE 875-125 MG PO TABS: 1.0000 | ORAL_TABLET | Freq: Two times a day (BID) | ORAL | 0 refills | Status: AC

## 2021-10-31 MED ORDER — AMOXICILLIN-POT CLAVULANATE 875-125 MG PO TABS: 1.0000 | ORAL_TABLET | Freq: Two times a day (BID) | ORAL | 0 refills | Status: DC

## 2021-10-31 NOTE — ED Provider Notes (Signed)
[facility]    CSN: [id number] Arrival date & time: 10/31/21  1507      History   Chief Complaint Chief Complaint  Patient presents with   Dental Problem    HPI Sarah Singh is a 55 y.o. female.   Patient presents today with a several week history of left lower molar pain and swelling.  Reports that she is attempted to contact her dentist but has been unable to schedule an appointment.  Denies any recent dental procedure.  Pain is gradually been worsening and over the past 48 hours has significantly worsened to 8 on a 0-10 pain scale, described as throbbing, worse with mastication, no alleviating factors notified.  She has tried over-the-counter analgesics without improvement of symptoms.  She denies any recent antibiotic use.  She has been able to eat and drink despite symptoms but has been subsisting on primarily soft foods that require little to no chewing.  She denies any swelling of her throat, difficulty swallowing, difficulty speaking, shortness of breath, fever, muffled voice.   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 Authorizing Provider  amoxicillin-clavulanate (AUGMENTIN) 875-125 MG tablet Take 1 tablet by mouth every 12 (twelve) hours for 10 days. 10/31/21 11/10/21 Yes Attie Nawabi, Derry Skill, PA-C  Calcium-Magnesium-Vitamin D 300-20-200 MG-MG-UNIT CHEW 2 chews by mouth twice daily. 08/19/21   Mack Hook, MD  fluconazole (DIFLUCAN) 150 MG tablet Take by mouth. 09/04/21   [provider]  hydrochlorothiazide (HYDRODIURIL) 25 MG tablet Take 1 tablet (25 mg total) by mouth daily. 08/19/21   Mack Hook, MD  lisinopril (ZESTRIL) 10 MG tablet Take 1 tablet (10 mg total) by mouth daily. 08/27/21   Mack Hook, MD  Multiple Vitamins-Minerals (MULTIVITAMIN ADULTS 50+ PO) Take 1 tablet by mouth daily.    [provider]  triamcinolone ointment (KENALOG) 0.5 % Apply 1 application topically at bedtime. 10/15/21   Landis Martins, DPM    Family History Family History  Problem Relation Age of Onset   Hypertension Mother    Diabetes Sister  Cancer Brother 29       throat cancer    Cancer Father     Social History Social History   Tobacco Use   Smoking status: Former    Types: Cigarettes    Start date: 03/08/1980    Quit date: 03/08/2013    Years since quitting: 8.6   Smokeless tobacco: Never  Vaping Use   Vaping Use: Never used  Substance Use Topics   Alcohol use: No    Comment: History of abuse.  Stopped in 2010   Drug use: No    Types: Cocaine    Comment: no use since 2010.      Allergies   Other   Review of Systems Review of Systems  Constitutional:  Positive for activity change. Negative for appetite change, fatigue and fever.  HENT:  Positive for dental problem and facial swelling. Negative for congestion, sinus pressure, sneezing and sore throat.   Respiratory:   Negative for cough and shortness of breath.   Cardiovascular:  Negative for chest pain.  Gastrointestinal:  Negative for abdominal pain, diarrhea, nausea and vomiting.  Neurological:  Positive for headaches. Negative for dizziness and light-headedness.    Physical Exam Triage Vital Signs ED Triage Vitals  Enc Vitals Group     BP 10/31/21 1610 130/76     Pulse Rate 10/31/21 1610 70     Resp 10/31/21 1610 20     Temp 10/31/21 1610 98.8 F (37.1 C)     Temp src --      SpO2 10/31/21 1610 100 %     Weight --      Height --      Head Circumference --      Peak Flow --      Pain Score 10/31/21 1608 8     Pain Loc --      Pain Edu? --      Excl. in [place]? --    No data found.  Updated Vital Signs BP 130/76    Pulse 70    Temp 98.8 F (37.1 C)    Resp 20    LMP  (LMP Unknown)    SpO2 100%   Visual Acuity Right Eye Distance:   Left Eye Distance:   Bilateral Distance:    Right Eye Near:   Left Eye Near:    Bilateral Near:     Physical Exam Vitals reviewed.  Constitutional:      General: She is awake. She is not in acute distress.    Appearance: Normal appearance. She is well-developed. She is not ill-appearing.     Comments: Very pleasant female appears stated age in no acute distress sitting comfortably in exam room  HENT:     Head: Normocephalic and atraumatic.     Right Ear: Tympanic membrane, ear canal and external ear normal. Tympanic membrane is not erythematous or bulging.     Left Ear: Tympanic membrane, ear canal and external ear normal. Tympanic membrane is not erythematous or bulging.     Nose:     Right Sinus: No maxillary sinus tenderness or frontal sinus tenderness.     Left Sinus: No maxillary sinus tenderness or frontal sinus tenderness.     Mouth/Throat:     Dentition: Abnormal dentition. Dental tenderness, gingival swelling, dental caries and dental abscesses present.     Pharynx: Uvula midline. No oropharyngeal exudate or posterior oropharyngeal erythema.       Comments: No evidence of Ludwig angina Cardiovascular:  Rate and Rhythm: Normal rate and regular rhythm.     Heart sounds: Normal heart sounds, S1 normal and S2 normal. No murmur heard. Pulmonary:     Effort: Pulmonary effort is normal.     Breath sounds: Normal breath sounds. No wheezing, rhonchi or rales.     Comments: Clear to auscultation bilaterally Psychiatric:        Behavior: Behavior is cooperative.     UC Treatments / Results  Labs (all labs ordered are listed, but only abnormal results are displayed) Labs Reviewed - No data to display  EKG   Radiology No results found.  Procedures Procedures (including critical care time)  Medications Ordered in UC Medications - No data to display  Initial Impression / Assessment and Plan / UC Course  I have reviewed the triage vital signs and the nursing notes.  Pertinent labs & imaging results that were available during my care of the patient were reviewed by me and considered in my medical decision making (see chart for details).     Dental abscess noted on exam.  Patient was started on Augmentin twice daily for 10 days.  She can use over-the-counter medication for additional relief including Tylenol and ibuprofen.  Recommended she use topical medications as well as gargling with warm salt water.  Discussed the importance of following up with a dentist and she was given contact information for local dental providers.  Discussed that if she has any worsening symptoms including fever, difficulty speaking, difficulty swallowing, swelling of her throat/tongue, muffled voice she needs to be seen immediately.  Strict return precautions given to which she expressed understanding.  Final Clinical Impressions(s) / UC Diagnoses   Final diagnoses:  Dental infection  Dentalgia     Discharge Instructions      Start Augmentin twice daily for 10 days.  Take this with food as it can upset your stomach.  Alternate Tylenol and  ibuprofen for pain.  Gargle with warm salt water and use Orajel for additional symptom relief.  It is importantly follow-up with dentist please call to schedule an appointment.  If you have any worsening symptoms including high fever not responding to medication, voice change, swelling of your throat/mouth, difficulty speaking, muffled voice you need to go to the emergency room.     ED Prescriptions     Medication Sig Dispense Auth. Provider   amoxicillin-clavulanate (AUGMENTIN) 875-125 MG tablet Take 1 tablet by mouth every 12 (twelve) hours for 10 days. 20 tablet Michille Mcelrath, Derry Skill, PA-C      PDMP not reviewed this encounter.   Terrilee Croak, PA-C 10/31/21 1702

## 2021-10-31 NOTE — Discharge Instructions (Signed)
Start Augmentin twice daily for 10 days.  Take this with food as it can upset your stomach.  Alternate Tylenol and ibuprofen for pain.  Gargle with warm salt water and use Orajel for additional symptom relief.  It is importantly follow-up with dentist please call to schedule an appointment.  If you have any worsening symptoms including high fever not responding to medication, voice change, swelling of your throat/mouth, difficulty speaking, muffled voice you need to go to the emergency room.

## 2021-10-31 NOTE — ED Triage Notes (Signed)
Pt reports multiple  areas of dental pain . Pt is asking for anti-bx.

## 2021-11-01 DIAGNOSIS — G4733 Obstructive sleep apnea (adult) (pediatric): Secondary | ICD-10-CM

## 2021-11-01 HISTORY — DX: Obstructive sleep apnea (adult) (pediatric): G47.33

## 2021-11-05 ENCOUNTER — Encounter: Payer: Self-pay | Admitting: *Deleted

## 2021-11-06 NOTE — Telephone Encounter (Signed)
Pt will wait until her appt 11/26/21 to discuss with Dr Amil Amen

## 2021-11-23 ENCOUNTER — Other Ambulatory Visit: Payer: Self-pay

## 2021-11-23 ENCOUNTER — Ambulatory Visit (AMBULATORY_SURGERY_CENTER): Payer: 59 | Admitting: *Deleted

## 2021-11-23 VITALS — Ht 63.0 in | Wt 242.0 lb

## 2021-11-23 DIAGNOSIS — Z1211 Encounter for screening for malignant neoplasm of colon: Secondary | ICD-10-CM

## 2021-11-23 NOTE — Progress Notes (Signed)

## 2021-11-26 ENCOUNTER — Encounter: Payer: Self-pay | Admitting: Internal Medicine

## 2021-11-26 ENCOUNTER — Ambulatory Visit: Payer: Self-pay | Admitting: Internal Medicine

## 2021-11-26 ENCOUNTER — Other Ambulatory Visit: Payer: Self-pay

## 2021-11-26 VITALS — BP 142/86 | HR 72 | Resp 20 | Ht 63.75 in | Wt 252.0 lb

## 2021-11-26 DIAGNOSIS — I10 Essential (primary) hypertension: Secondary | ICD-10-CM

## 2021-11-26 DIAGNOSIS — R0683 Snoring: Secondary | ICD-10-CM

## 2021-11-26 DIAGNOSIS — R682 Dry mouth, unspecified: Secondary | ICD-10-CM

## 2021-11-26 DIAGNOSIS — G8929 Other chronic pain: Secondary | ICD-10-CM

## 2021-11-26 DIAGNOSIS — Z6841 Body Mass Index (BMI) 40.0 and over, adult: Secondary | ICD-10-CM

## 2021-11-26 DIAGNOSIS — K089 Disorder of teeth and supporting structures, unspecified: Secondary | ICD-10-CM

## 2021-11-26 DIAGNOSIS — G5601 Carpal tunnel syndrome, right upper limb: Secondary | ICD-10-CM

## 2021-11-26 MED ORDER — IBUPROFEN 400 MG PO TABS: ORAL_TABLET | ORAL | 0 refills | Status: DC

## 2021-11-26 NOTE — Patient Instructions (Signed)
Cock up splint likely a large for right hand--can get at [facility]

## 2021-11-26 NOTE — Progress Notes (Unsigned)
° ° °  Subjective:    Patient ID: Sarah Singh, female   DOB: [birth year], 56 y.o.   MRN: [id number]   HPI   Dental concerns:  went to the dentist and was sent to an oral surgeon to have wisdom tooth pulled.  Her [employer] coverage does not cover the surgery.  Cost was $1800.  Has just one wisdom tooth--left lower than needs to be removed.  She has checked with other oral surgeons, but no one takes her [place] insurance.  She has not called the insurance to see if they can direct her for care as of yet.  2.  Right middle finger goes to sleep when sleeping.  She started using a velcro knee brace she had at home, which has helped.  She otherwise shakes her hand out and the feeling returns.  No problem during day.    3.  Dry mouth:  snores at night and thinks this is what is causing this.  Denies daytime somnolence.    4.  Hypertension:  off Lisinopril for 2 days.  Did not realize she had refills.    5.  Obesity:  discussed making goals with physical activity and diet.  She has lost 8 lbs since last visit.  Has stopped soda intake for about 2 months.  Discussed water exercise:  [facility]  6.  Does not have issues with nails.    7.  Trich and Chlamydia last fall:  Chlamydia treated and test in November when positive for trichomonas negative.  Symptoms of trichomonas resolved with subsequent treatment in November.  She is no longer with her partner that caused the infections.        Current Meds  Medication Sig   Calcium-Magnesium-Vitamin D 300-20-200 MG-MG-UNIT CHEW 2 chews by mouth twice daily.   hydrochlorothiazide (HYDRODIURIL) 25 MG tablet Take 1 tablet (25 mg total) by mouth daily.   lisinopril (ZESTRIL) 10 MG tablet Take 1 tablet (10 mg total) by mouth daily.   Multiple Vitamins-Minerals (MULTIVITAMIN ADULTS 50+ PO) Take 1 tablet by mouth daily.   triamcinolone ointment (KENALOG) 0.5 % Apply 1 application topically at bedtime.   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

## 2021-12-03 ENCOUNTER — Encounter: Payer: Self-pay | Admitting: Gastroenterology

## 2021-12-08 ENCOUNTER — Encounter: Payer: Self-pay | Admitting: Gastroenterology

## 2021-12-08 ENCOUNTER — Ambulatory Visit (AMBULATORY_SURGERY_CENTER): Payer: 59 | Admitting: Gastroenterology

## 2021-12-08 ENCOUNTER — Other Ambulatory Visit: Payer: Self-pay

## 2021-12-08 VITALS — BP 139/82 | HR 68 | Temp 97.3°F | Resp 16 | Ht 63.7 in | Wt 242.0 lb

## 2021-12-08 DIAGNOSIS — K621 Rectal polyp: Secondary | ICD-10-CM | POA: Diagnosis not present

## 2021-12-08 DIAGNOSIS — D129 Benign neoplasm of anus and anal canal: Secondary | ICD-10-CM

## 2021-12-08 DIAGNOSIS — Z1211 Encounter for screening for malignant neoplasm of colon: Secondary | ICD-10-CM

## 2021-12-08 DIAGNOSIS — D128 Benign neoplasm of rectum: Secondary | ICD-10-CM

## 2021-12-08 MED ORDER — SODIUM CHLORIDE 0.9 % IV SOLN: 500.0000 mL | Freq: Once | INTRAVENOUS | Status: DC

## 2021-12-08 NOTE — Progress Notes (Signed)
Pt non-responsive, VVS, Report to RN  °

## 2021-12-08 NOTE — Progress Notes (Signed)
Called to room to assist during endoscopic procedure.  Patient ID and intended procedure confirmed with present staff. Received instructions for my participation in the procedure from the performing physician.  

## 2021-12-08 NOTE — Patient Instructions (Signed)
Handouts given for polyps.  YOU HAD AN ENDOSCOPIC PROCEDURE TODAY AT THE [facility]:   Refer to the procedure report that was given to you for any specific questions about what was found during the examination.  If the procedure report does not answer your questions, please call your gastroenterologist to clarify.  If you requested that your care partner not be given the details of your procedure findings, then the procedure report has been included in a sealed envelope for you to review at your convenience later.  YOU SHOULD EXPECT: Some feelings of bloating in the abdomen. Passage of more gas than usual.  Walking can help get rid of the air that was put into your GI tract during the procedure and reduce the bloating. If you had a lower endoscopy (such as a colonoscopy or flexible sigmoidoscopy) you may notice spotting of blood in your stool or on the toilet paper. If you underwent a bowel prep for your procedure, you may not have a normal bowel movement for a few days.  Please Note:  You might notice some irritation and congestion in your nose or some drainage.  This is from the oxygen used during your procedure.  There is no need for concern and it should clear up in a day or so.  SYMPTOMS TO REPORT IMMEDIATELY:  Following lower endoscopy (colonoscopy or flexible sigmoidoscopy):  Excessive amounts of blood in the stool  Significant tenderness or worsening of abdominal pains  Swelling of the abdomen that is new, acute  Fever of 100F or higher  For urgent or emergent issues, a gastroenterologist can be reached at any hour by calling [phone]. Do not use MyChart messaging for urgent concerns.    DIET:  We do recommend a small meal at first, but then you may proceed to your regular diet.  Drink plenty of fluids but you should avoid alcoholic beverages for 24 hours.  ACTIVITY:  You should plan to take it easy for the rest of today and you should NOT DRIVE or use heavy  machinery until tomorrow (because of the sedation medicines used during the test).    FOLLOW UP: Our staff will call the number listed on your records 48-72 hours following your procedure to check on you and address any questions or concerns that you may have regarding the information given to you following your procedure. If we do not reach you, we will leave a message.  We will attempt to reach you two times.  During this call, we will ask if you have developed any symptoms of COVID 19. If you develop any symptoms (ie: fever, flu-like symptoms, shortness of breath, cough etc.) before then, please call [phone].  If you test positive for Covid 19 in the 2 weeks post procedure, please call and report this information to us.    If any biopsies were taken you will be contacted by phone or by letter within the next 1-3 weeks.  Please call us at [phone] if you have not heard about the biopsies in 3 weeks.    SIGNATURES/CONFIDENTIALITY: You and/or your care partner have signed paperwork which will be entered into your electronic medical record.  These signatures attest to the fact that that the information above on your After Visit Summary has been reviewed and is understood.  Full responsibility of the confidentiality of this discharge information lies with you and/or your care-partner.  

## 2021-12-08 NOTE — Op Note (Signed)
[place] Patient Name: Sarah Singh Procedure Date: 12/08/2021 7:59 AM MRN: [id number] Endoscopist: Thornton Park MD, MD Age: 56 Referring MD:  Date of Birth: [birth year] Gender: Female Account #: [id number] Procedure:                Colonoscopy Indications:              Screening for colorectal malignant neoplasm, This                            is the patient's first colonoscopy                           No known family history of colon cancer or polyps Medicines:                Monitored Anesthesia Care Procedure:                Pre-Anesthesia Assessment:                           - Prior to the procedure, a History and Physical                            was performed, and patient medications and                            allergies were reviewed. The patient's tolerance of                            previous anesthesia was also reviewed. The risks                            and benefits of the procedure and the sedation                            options and risks were discussed with the patient.                            All questions were answered, and informed consent                            was obtained. Prior Anticoagulants: The patient has                            taken no previous anticoagulant or antiplatelet                            agents. ASA Grade Assessment: II - A patient with                            mild systemic disease. After reviewing the risks                            and benefits, the patient was deemed in  satisfactory condition to undergo the procedure.                           After obtaining informed consent, the colonoscope                            was passed under direct vision. Throughout the                            procedure, the patient's blood pressure, pulse, and                            oxygen saturations were monitored continuously. The                            Colonoscope was introduced  through the anus and                            advanced to the 3 cm into the ileum. A second                            forward view of the right colon was performed. The                            colonoscopy was performed without difficulty. The                            patient tolerated the procedure well. The quality                            of the bowel preparation was good. The terminal                            ileum, ileocecal valve, appendiceal orifice, and                            rectum were photographed. Scope In: 8:11:55 AM Scope Out: 8:29:29 AM Scope Withdrawal Time: 0 hours 14 minutes 12 seconds  Total Procedure Duration: 0 hours 17 minutes 34 seconds  Findings:                 The perianal and digital rectal examinations were                            normal.                           Non-bleeding internal hemorrhoids were found.                           Two flat polyps were found in the rectum. The                            polyps were 1 mm in size. These polyps were removed  with a cold biopsy forceps. Resection and retrieval                            were complete. Estimated blood loss was minimal.                           The exam was otherwise without abnormality. Complications:            No immediate complications. Estimated Blood Loss:     Estimated blood loss: none. Impression:               - Non-bleeding internal hemorrhoids.                           - The entire examined colon is normal.                           - No specimens collected. Recommendation:           - Patient has a contact number available for                            emergencies. The signs and symptoms of potential                            delayed complications were discussed with the                            patient. Return to normal activities tomorrow.                            Written discharge instructions were provided to the                             patient.                           - Resume previous diet.                           - Continue present medications.                           - Repeat colonoscopy to be determined after the                            pathology results are available.                           - Emerging evidence supports eating a diet of                            fruits, vegetables, grains, calcium, and yogurt                            while reducing red meat and alcohol may reduce the  risk of colon cancer.                           - Thank you for allowing me to be involved in your                            colon cancer prevention. Thornton Park MD, MD 12/08/2021 8:37:21 AM This report has been signed electronically.

## 2021-12-08 NOTE — Progress Notes (Signed)
° °  Referring Provider: Mack Hook, MD Primary Care Physician:  Mack Hook, MD  Reason for Procedure:  Colon cancer screening   IMPRESSION:  Need for colon cancer screening Appropriate candidate for monitored anesthesia care  PLAN: Colonoscopy in the [place] today   HPI: Sarah Singh is a 56 y.o. female presents for screening colonoscopy.  No prior colonoscopy or colon cancer screening.  No baseline GI symptoms.   No known family history of colon cancer or polyps. No family history of uterine/endometrial cancer, pancreatic cancer or gastric/stomach cancer.   Past Medical History:  Diagnosis Date   Arthritis    knees and hands   Essential hypertension 07/25/2018   Prediabetes 12/18/2014   Vitamin D deficiency 2016    Past Surgical History:  Procedure Laterality Date    TM tubes      TUBAL LIGATION  1992    Current Outpatient Medications  Medication Sig Dispense Refill   Calcium-Magnesium-Vitamin D 300-20-200 MG-MG-UNIT CHEW 2 chews by mouth twice daily.     hydrochlorothiazide (HYDRODIURIL) 25 MG tablet Take 1 tablet (25 mg total) by mouth daily. 30 tablet 11   lisinopril (ZESTRIL) 10 MG tablet Take 1 tablet (10 mg total) by mouth daily. 90 tablet 3   Multiple Vitamins-Minerals (MULTIVITAMIN ADULTS 50+ PO) Take 1 tablet by mouth daily.     ibuprofen (IBU) 400 MG tablet 1 tab by mouth twice daily with meals for 14 days and then as needed (Patient not taking: Reported on 12/08/2021) 60 tablet 0   triamcinolone ointment (KENALOG) 0.5 % Apply 1 application topically at bedtime. 30 g 1   Current Facility-Administered Medications  Medication Dose Route Frequency Provider Last Rate Last Admin   0.9 %  sodium chloride infusion  500 mL Intravenous Once Thornton Park, MD        Allergies as of 12/08/2021 - Review Complete 12/08/2021  Allergen Reaction Noted   Other Other (See Comments)     Family History  Problem Relation Age of Onset   Hypertension  Mother    Cancer Father    Diabetes Sister    Esophageal cancer Brother    Cancer Brother 108       throat cancer    Colon cancer Neg Hx    Colon polyps Neg Hx    Rectal cancer Neg Hx    Stomach cancer Neg Hx      Physical Exam: General:   Alert,  well-nourished, pleasant and cooperative in NAD Head:  Normocephalic and atraumatic. Eyes:  Sclera clear, no icterus.   Conjunctiva pink. Mouth:  No deformity or lesions.   Neck:  Supple; no masses or thyromegaly. Lungs:  Clear throughout to auscultation.   No wheezes. Heart:  Regular rate and rhythm; no murmurs. Abdomen:  Soft, non-tender, nondistended, normal bowel sounds, no rebound or guarding.  Msk:  Symmetrical. No boney deformities LAD: No inguinal or umbilical LAD Extremities:  No clubbing or edema. Neurologic:  Alert and  oriented x4;  grossly nonfocal Skin:  No obvious rash or bruise. Psych:  Alert and cooperative. Normal mood and affect.     Studies/Results: No results found.    Armani Brar L. Tarri Glenn, MD, MPH 12/08/2021, 8:05 AM

## 2021-12-08 NOTE — Progress Notes (Signed)
Pt's states no medical or surgical changes since previsit or office visit. 

## 2021-12-10 ENCOUNTER — Telehealth: Payer: Self-pay

## 2021-12-10 ENCOUNTER — Encounter: Payer: Self-pay | Admitting: Gastroenterology

## 2021-12-10 NOTE — Telephone Encounter (Signed)
°  Follow up Call-  Call back number 12/08/2021  Post procedure Call Back phone  # [phone]  Permission to leave phone message Yes  Some recent data might be hidden     Patient questions:  Do you have a fever, pain , or abdominal swelling? No. Pain Score  0 *  Have you tolerated food without any problems? Yes.    Have you been able to return to your normal activities? Yes.    Do you have any questions about your discharge instructions: Diet   No. Medications  No. Follow up visit  No.  Do you have questions or concerns about your Care? No.  Actions: * If pain score is 4 or above: No action needed, pain <4.

## 2022-02-25 DIAGNOSIS — G8929 Other chronic pain: Secondary | ICD-10-CM | POA: Insufficient documentation

## 2022-02-25 DIAGNOSIS — R0683 Snoring: Secondary | ICD-10-CM | POA: Insufficient documentation

## 2022-03-25 ENCOUNTER — Encounter (HOSPITAL_COMMUNITY): Payer: Self-pay | Admitting: Emergency Medicine

## 2022-03-25 ENCOUNTER — Ambulatory Visit (HOSPITAL_COMMUNITY)
Admission: EM | Admit: 2022-03-25 | Discharge: 2022-03-25 | Disposition: A | Discharge status: Home / Self Care | Payer: 59 | Attending: Emergency Medicine | Admitting: Emergency Medicine

## 2022-03-25 DIAGNOSIS — S161XXA Strain of muscle, fascia and tendon at neck level, initial encounter: Secondary | ICD-10-CM | POA: Diagnosis not present

## 2022-03-25 MED ORDER — IBUPROFEN 600 MG PO TABS: 600.0000 mg | ORAL_TABLET | Freq: Three times a day (TID) | ORAL | 0 refills | Status: DC | PRN

## 2022-03-25 MED ORDER — CYCLOBENZAPRINE HCL 5 MG PO TABS: 5.0000 mg | ORAL_TABLET | Freq: Three times a day (TID) | ORAL | 0 refills | Status: DC | PRN

## 2022-03-25 NOTE — ED Provider Notes (Signed)
[place]    CSN: [id number] Arrival date & time: 03/25/22  0802      History   Chief Complaint Chief Complaint  Patient presents with   Neck Pain    HPI Sarah Singh is a 56 y.o. female. Pt denies specific injury to R shoulder and neck, denies recent heavy lifting. Pain started 3 days ago.  She thinks she woke up with it.  She has been using Vicks VapoRub on the sore area without relief.  She is also been using a heating pad and slept a lot last night.  Feels that heat helps somewhat is worse this morning.  Denies numbness or tingling.  Denies weakness.  Range of motion in neck is limited on the right side due to pain   Neck Pain  Past Medical History:  Diagnosis Date   Arthritis    knees and hands   Essential hypertension 07/25/2018   Prediabetes 12/18/2014   Vitamin D deficiency 2016    Patient Active Problem List   Diagnosis Date Noted   Snoring 02/25/2022   Chronic dental pain 02/25/2022   Chronic pain of both knees 08/19/2021   Right foot pain 08/19/2021   Lipoma of left upper extremity 10/09/2019   Class 3 severe obesity with serious comorbidity and body mass index (BMI) of 40.0 to 44.9 in adult ([place]) 06/24/2019   Decreased visual acuity 10/29/2018   Guaiac + stool 10/29/2018   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 Authorizing Provider  cyclobenzaprine (FLEXERIL) 5 MG tablet Take 1 tablet (5 mg  total) by mouth 3 (three) times daily as needed for muscle spasms. 03/25/22  Yes Carvel Getting, NP  ibuprofen (ADVIL) 600 MG tablet Take 1 tablet (600 mg total) by mouth every 8 (eight) hours as needed. 03/25/22  Yes Carvel Getting, NP  Calcium-Magnesium-Vitamin D 300-20-200 MG-MG-UNIT CHEW 2 chews by mouth twice daily. 08/19/21   Mack Hook, MD  hydrochlorothiazide (HYDRODIURIL) 25 MG tablet Take 1 tablet (25 mg total) by mouth daily. 08/19/21   Mack Hook, MD  lisinopril (ZESTRIL) 10 MG tablet Take 1 tablet (10 mg total) by mouth daily. 08/27/21   Mack Hook, MD  Multiple Vitamins-Minerals (MULTIVITAMIN ADULTS 50+ PO) Take 1 tablet by mouth daily.    [provider]  triamcinolone ointment (KENALOG) 0.5 % Apply 1 application topically at bedtime. 10/15/21   Landis Martins, DPM    Family History Family History  Problem Relation Age of Onset   Hypertension Mother    Cancer Father    Diabetes Sister    Esophageal cancer  Brother    Cancer Brother 34       throat cancer    Colon cancer Neg Hx    Colon polyps Neg Hx    Rectal cancer Neg Hx    Stomach cancer Neg Hx     Social History Social History   Tobacco Use   Smoking status: Former    Types: Cigarettes    Start date: 03/08/1980    Quit date: 03/08/2013    Years since quitting: 9.0   Smokeless tobacco: Never  Vaping Use   Vaping Use: Never used  Substance Use Topics   Alcohol use: No    Comment: History of abuse.  Stopped in 2010   Drug use: No    Types: Cocaine    Comment: no use since 2010.      Allergies   Other   Review of Systems Review of Systems  Musculoskeletal:  Positive for neck pain.    Physical Exam Triage Vital Signs ED Triage Vitals  Enc Vitals Group     BP 03/25/22 0817 139/78     Pulse Rate 03/25/22 0817 73     Resp 03/25/22 0817 18     Temp 03/25/22 0817 98.6 F (37 C)     Temp src --      SpO2 03/25/22 0817 98 %     Weight --      Height --      Head  Circumference --      Peak Flow --      Pain Score 03/25/22 0815 8     Pain Loc --      Pain Edu? --      Excl. in [place]? --    No data found.  Updated Vital Signs BP 139/78   Pulse 73   Temp 98.6 F (37 C)   Resp 18   LMP  (LMP Unknown)   SpO2 98%   Visual Acuity Right Eye Distance:   Left Eye Distance:   Bilateral Distance:    Right Eye Near:   Left Eye Near:    Bilateral Near:     Physical Exam Constitutional:      Appearance: Normal appearance. She is not ill-appearing.  Pulmonary:     Effort: Pulmonary effort is normal.  Musculoskeletal:     Cervical back: Tenderness present. No swelling, deformity, erythema, rigidity, spasms, torticollis or bony tenderness. Decreased range of motion.       Back:  Neurological:     Mental Status: She is alert.     UC Treatments / Results  Labs (all labs ordered are listed, but only abnormal results are displayed) Labs Reviewed - No data to display  EKG   Radiology No results found.  Procedures Procedures (including critical care time)  Medications Ordered in UC Medications - No data to display  Initial Impression / Assessment and Plan / UC Course  I have reviewed the triage vital signs and the nursing notes.  Pertinent labs & imaging results that were available during my care of the patient were reviewed by me and considered in my medical decision making (see chart for details).     Muscle strain in the right trapezius.  Reviewed course of recovery.  Reviewed supportive care measures.  Discussed proper use of heat therapy.  Described Flexeril and ibuprofen.  Patient works at [employer] does not feel she can do her job right now.  Given note for work  Final Clinical Impressions(s) / UC Diagnoses  Final diagnoses:  Strain of neck muscle, initial encounter     Discharge Instructions      It's ok to do gentle stretching of your hurt muscle, but don't force it or do more than your neck is ready for. Use the  cyclobenzaprine up to 3 times a day, but it can make you sleepy, so if you have to work or drive, take it only at bedtime.  If you are not getting better, follow up with a sports medicine clinic (2 are listed below).  You can use things like icy hot on your muscle if it helps with the pain.  Use heat therapy per instructions in this packet.     ED Prescriptions     Medication Sig Dispense Auth. Provider   cyclobenzaprine (FLEXERIL) 5 MG tablet Take 1 tablet (5 mg total) by mouth 3 (three) times daily as needed for muscle spasms. 21 tablet Carvel Getting, NP   ibuprofen (ADVIL) 600 MG tablet Take 1 tablet (600 mg total) by mouth every 8 (eight) hours as needed. 30 tablet Carvel Getting, NP      PDMP not reviewed this encounter.   Carvel Getting, NP 03/25/22 [phone]

## 2022-03-25 NOTE — ED Triage Notes (Signed)
Pt is present today with c/o right neck and shoulder pain. Pt sx started x3 days ago

## 2022-03-25 NOTE — Discharge Instructions (Addendum)
It's ok to do gentle stretching of your hurt muscle, but don't force it or do more than your neck is ready for. Use the cyclobenzaprine up to 3 times a day, but it can make you sleepy, so if you have to work or drive, take it only at bedtime.  If you are not getting better, follow up with a sports medicine clinic (2 are listed below).  You can use things like icy hot on your muscle if it helps with the pain.  Use heat therapy per instructions in this packet.

## 2022-03-31 ENCOUNTER — Other Ambulatory Visit: Payer: Self-pay

## 2022-03-31 ENCOUNTER — Emergency Department (HOSPITAL_COMMUNITY): Payer: 59

## 2022-03-31 ENCOUNTER — Emergency Department (HOSPITAL_COMMUNITY)
Admission: EM | Admit: 2022-03-31 | Discharge: 2022-03-31 | Disposition: A | Discharge status: Home / Self Care | Payer: 59 | Attending: Emergency Medicine | Admitting: Emergency Medicine

## 2022-03-31 ENCOUNTER — Encounter (HOSPITAL_COMMUNITY): Payer: Self-pay | Admitting: Emergency Medicine

## 2022-03-31 DIAGNOSIS — Z79899 Other long term (current) drug therapy: Secondary | ICD-10-CM | POA: Diagnosis not present

## 2022-03-31 DIAGNOSIS — I10 Essential (primary) hypertension: Secondary | ICD-10-CM | POA: Insufficient documentation

## 2022-03-31 DIAGNOSIS — R519 Headache, unspecified: Secondary | ICD-10-CM | POA: Insufficient documentation

## 2022-03-31 DIAGNOSIS — R42 Dizziness and giddiness: Secondary | ICD-10-CM | POA: Diagnosis present

## 2022-03-31 DIAGNOSIS — R531 Weakness: Secondary | ICD-10-CM | POA: Insufficient documentation

## 2022-03-31 DIAGNOSIS — R7309 Other abnormal glucose: Secondary | ICD-10-CM | POA: Diagnosis not present

## 2022-03-31 DIAGNOSIS — H538 Other visual disturbances: Secondary | ICD-10-CM | POA: Diagnosis not present

## 2022-03-31 LAB — CBC WITH DIFFERENTIAL/PLATELET
Abs Immature Granulocytes: 0.02 10*3/uL (ref 0.00–0.07)
Basophils Absolute: 0.1 10*3/uL (ref 0.0–0.1)
Basophils Relative: 1 %
Eosinophils Absolute: 0.2 10*3/uL (ref 0.0–0.5)
Eosinophils Relative: 3 %
HCT: 38.7 % (ref 36.0–46.0)
Hemoglobin: 12.6 g/dL (ref 12.0–15.0)
Immature Granulocytes: 0 %
Lymphocytes Relative: 34 %
Lymphs Abs: 2.8 10*3/uL (ref 0.7–4.0)
MCH: 30.7 pg (ref 26.0–34.0)
MCHC: 32.6 g/dL (ref 30.0–36.0)
MCV: 94.4 fL (ref 80.0–100.0)
Monocytes Absolute: 0.8 10*3/uL (ref 0.1–1.0)
Monocytes Relative: 9 %
Neutro Abs: 4.4 10*3/uL (ref 1.7–7.7)
Neutrophils Relative %: 53 %
Platelets: 290 10*3/uL (ref 150–400)
RBC: 4.1 MIL/uL (ref 3.87–5.11)
RDW: 13.2 % (ref 11.5–15.5)
WBC: 8.3 10*3/uL (ref 4.0–10.5)
nRBC: 0 % (ref 0.0–0.2)

## 2022-03-31 LAB — COMPREHENSIVE METABOLIC PANEL
ALT: 16 U/L (ref 0–44)
AST: 19 U/L (ref 15–41)
Albumin: 3.4 g/dL — ABNORMAL LOW (ref 3.5–5.0)
Alkaline Phosphatase: 74 U/L (ref 38–126)
Anion gap: 7 (ref 5–15)
BUN: 11 mg/dL (ref 6–20)
CO2: 25 mmol/L (ref 22–32)
Calcium: 9.2 mg/dL (ref 8.9–10.3)
Chloride: 106 mmol/L (ref 98–111)
Creatinine, Ser: 0.69 mg/dL (ref 0.44–1.00)
GFR, Estimated: >60 mL/min (ref >60)
Glucose, Bld: 105 mg/dL — ABNORMAL HIGH (ref 70–99)
Potassium: 3.6 mmol/L (ref 3.5–5.1)
Sodium: 138 mmol/L (ref 135–145)
Total Bilirubin: 0.6 mg/dL (ref 0.3–1.2)
Total Protein: 7.6 g/dL (ref 6.5–8.1)

## 2022-03-31 LAB — CBG MONITORING, ED: Glucose-Capillary: 98 mg/dL (ref 70–99)

## 2022-03-31 MED ORDER — SODIUM CHLORIDE 0.9 % IV BOLUS
1000.0000 mL | Freq: Once | INTRAVENOUS | Status: AC
  Administered 2022-03-31: 1000 mL via INTRAVENOUS

## 2022-03-31 NOTE — Discharge Instructions (Addendum)
Your work up today was reassuring. Please continue drinking plenty of fluids at home. Continue your medications as prescribed. Please schedule a follow up appointment with your PCP.  Please return if your symptoms return, you develop chest pain, shortness of breath, or worsening more concerning symptoms.

## 2022-03-31 NOTE — ED Provider Notes (Signed)
[facility] Provider Note   CSN: [id number] Arrival date & time: 03/31/22  0859     History PMH: Prediabetes, HTN, obesity Chief Complaint  Patient presents with   Dizziness    Sarah Singh is a 56 y.o. female. Presents the ED with a chief complaint of lightheadedness and dizziness.  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  She said she felt like she was going to pass out and felt really weak.  She is also had a mild headache since this happened which is unusual for her.  She says the dizziness has continued. Dizziness not associated with positional changes.  She has associated blurry visions in both eyes. She also had some nausea initially, but this has resolved. She said when they called EMS, she was told that her blood pressure was high and was measured at 190/110.  She did state that she took her blood pressure medications this morning and has been compliant.  She has never had this happen before. She has not drank a lot of water today. She denies any numbness, speech changes, weakness, gait abnormalities, nausea, or vomiting.  Dizziness Associated symptoms: headaches, nausea and weakness   Associated symptoms: no chest pain, no shortness of breath and no vomiting       Home Medications Prior to Admission medications   Medication Sig Start Date End Date Taking? Authorizing Provider  Calcium-Magnesium-Vitamin D 300-20-200 MG-MG-UNIT CHEW 2 chews by mouth twice daily. 08/19/21  Yes Mack Hook, MD  hydrochlorothiazide (HYDRODIURIL) 25 MG tablet Take 1 tablet (25 mg total) by mouth daily. 08/19/21  Yes Mack Hook, MD  lisinopril (ZESTRIL) 10 MG tablet Take 1 tablet (10 mg total) by mouth daily. 08/27/21  Yes Mack Hook, MD  cyclobenzaprine (FLEXERIL) 5 MG tablet Take 1 tablet (5 mg total) by mouth 3 (three) times daily as needed  for muscle spasms. 03/25/22   Carvel Getting, NP  ibuprofen (ADVIL) 600 MG tablet Take 1 tablet (600 mg total) by mouth every 8 (eight) hours as needed. Patient not taking: Reported on 03/31/2022 03/25/22   Carvel Getting, NP  Multiple Vitamins-Minerals (MULTIVITAMIN ADULTS 50+ PO) Take 1 tablet by mouth daily. Patient not taking: Reported on 03/31/2022    [provider]  triamcinolone ointment (KENALOG) 0.5 % Apply 1 application topically at bedtime. Patient not taking: Reported on 03/31/2022 10/15/21   Landis Martins, DPM      Allergies    Other    Review of Systems   Review of Systems  Constitutional:  Negative for chills and fever.  HENT:  Negative for congestion and sore throat.   Respiratory:  Negative for shortness of breath and wheezing.   Cardiovascular:  Negative for chest pain.  Gastrointestinal:  Positive for nausea. Negative for abdominal pain and vomiting.  Neurological:  Positive for dizziness, weakness, light-headedness and headaches. Negative for syncope, facial asymmetry, speech difficulty and numbness.   Physical Exam Updated Vital Signs BP 123/71   Pulse 75   Temp 97.9 F (36.6 C) (Oral)   Resp 15   Ht '5\' 2"'$  (1.575 m)   Wt 109.8 kg   LMP  (LMP Unknown)   SpO2 100%   BMI 44.26 kg/m  Physical Exam Vitals and nursing note reviewed.  Constitutional:      General: She is not in acute distress.    Appearance: Normal appearance. She  is obese. She is not ill-appearing, toxic-appearing or diaphoretic.  HENT:     Head: Normocephalic and atraumatic.     Nose: No nasal deformity.     Mouth/Throat:     Lips: Pink. No lesions.     Mouth: Mucous membranes are moist. No injury, lacerations, oral lesions or angioedema.     Pharynx: Oropharynx is clear. Uvula midline. No pharyngeal swelling, oropharyngeal exudate, posterior oropharyngeal erythema or uvula swelling.  Eyes:     General: Gaze aligned appropriately. No scleral icterus.       Right eye: No  discharge.        Left eye: No discharge.     Extraocular Movements: Extraocular movements intact.     Conjunctiva/sclera: Conjunctivae normal.     Right eye: Right conjunctiva is not injected. No exudate or hemorrhage.    Left eye: Left conjunctiva is not injected. No exudate or hemorrhage.    Pupils: Pupils are equal, round, and reactive to light.  Cardiovascular:     Rate and Rhythm: Normal rate and regular rhythm.     Pulses: Normal pulses.          Radial pulses are 2+ on the right side and 2+ on the left side.       Dorsalis pedis pulses are 2+ on the right side and 2+ on the left side.     Heart sounds: Normal heart sounds, S1 normal and S2 normal. Heart sounds not distant. No murmur heard.   No friction rub. No gallop. No S3 or S4 sounds.  Pulmonary:     Effort: Pulmonary effort is normal. No accessory muscle usage or respiratory distress.     Breath sounds: Normal breath sounds. No stridor. No wheezing, rhonchi or rales.  Chest:     Chest wall: No tenderness.  Abdominal:     General: Abdomen is flat. There is no distension.     Palpations: Abdomen is soft. There is no mass or pulsatile mass.     Tenderness: There is no abdominal tenderness. There is no guarding or rebound.  Musculoskeletal:     Cervical back: Neck supple. No rigidity.     Right lower leg: No edema.     Left lower leg: No edema.  Skin:    General: Skin is warm and dry.     Coloration: Skin is not jaundiced or pale.     Findings: No bruising, erythema, lesion or rash.  Neurological:     General: No focal deficit present.     Mental Status: She is alert and oriented to person, place, and time.     GCS: GCS eye subscore is 4. GCS verbal subscore is 5. GCS motor subscore is 6.     Comments: Alert and Oriented x 3 Speech clear with no aphasia Cranial Nerve testing - Visual Fields grossly intact - PERRLA. EOM intact. No Nystagmus - Facial Sensation grossly intact - No facial asymmetry - Uvula and Tongue  Midline - Accessory Muscles intact Motor: - 5/5 motor strength in all four extremities.  - No pronator Drift Sensation: - Grossly intact in all four extremities.  Coordination:  - Finger to nose and heel to shin intact bilaterally   Psychiatric:        Mood and Affect: Mood normal.        Behavior: Behavior normal. Behavior is cooperative.    ED Results / Procedures / Treatments   Labs (all labs ordered are listed, but only abnormal results are displayed)  Labs Reviewed  COMPREHENSIVE METABOLIC PANEL - Abnormal; Notable for the following components:      Result Value   Glucose, Bld 105 (*)    Albumin 3.4 (*)    All other components within normal limits  CBC WITH DIFFERENTIAL/PLATELET  CBG MONITORING, ED    EKG EKG Interpretation  Date/Time:  Wednesday Mar 31 2022 09:05:50 EDT Ventricular Rate:  83 PR Interval:  192 QRS Duration: 95 QT Interval:  379 QTC Calculation: 446 R Axis:   -9 Text Interpretation: Sinus rhythm Confirmed by Octaviano Glow [phone]) on 03/31/2022 9:13:38 AM  Radiology CT Head Wo Contrast  Result Date: 03/31/2022 CLINICAL DATA:  Sudden onset of dizziness while at work. Headache, new or worsening. EXAM: CT HEAD WITHOUT CONTRAST TECHNIQUE: Contiguous axial images were obtained from the base of the skull through the vertex without intravenous contrast. RADIATION DOSE REDUCTION: This exam was performed according to the departmental dose-optimization program which includes automated exposure control, adjustment of the mA and/or kV according to patient size and/or use of iterative reconstruction technique. COMPARISON:  None Available. FINDINGS: Brain: There is no evidence of acute intracranial hemorrhage, mass lesion, brain edema or extra-axial fluid collection. The ventricles and subarachnoid spaces are appropriately sized for age. There is no CT evidence of acute cortical infarction. Vascular: Intracranial atherosclerosis. No hyperdense vessel identified.  Skull: Negative for fracture or focal lesion. Sinuses/Orbits: The visualized paranasal sinuses and mastoid air cells are clear. No orbital abnormalities are seen. Other: Deformity of the lamina papyracea on the left which appears chronic. No acute osseous findings. IMPRESSION: No acute intracranial findings. 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 Electronically Signed   By: Richardean Sale M.D.   On: 03/31/2022 10:12    Procedures Procedures  This patient was on telemetry or cardiac monitoring during their time in the ED.    Medications Ordered in ED Medications  sodium chloride 0.9 % bolus 1,000 mL (1,000 mLs Intravenous New Bag/Given 03/31/22 1914)    ED Course/ Medical Decision Making/ A&P Clinical Course as of 03/31/22 1054  Wed Mar 31, 2022  0942 56 yo female presenting from work with sudden onset of lightheadedness, with onset of headache at the same time.  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  EMS reported BP 190/110, but on check here BP is 124/65 on my assessment.  She is essentially asymptomatic, says her vision is back to normal, she has a very mild frontal headache but it is improved, she is here with her daughter at the bedside.  Patient reports she does not drink a lot of water at home and does not keep herself well hydrated.  She reports she does have a history of getting lightheaded when getting up quickly.  She denies history of MI, arrhythmia, TIA or stroke.  She has a history of hypertension and high cholesterol, no history of diabetes or smoking.  She denies any personal or family history of brain aneurysm.  Her neurological exam is completely benign, including her visual fields.  Her EKG per my interpretation shows a normal sinus rhythm with no acute ischemic findings.  Her glucose is within  normal limits.  She is pending labs, including electrolyte check, anemia check, and a CT scan of the head, given acute onset of lightheadedness with headache earlier. [MT]  1037 Orthostatic negative [  GL]    Clinical Course User Index [GL] Sigourney Portillo, Adora Fridge, PA-C [MT] Langston Masker Carola Rhine, MD                           Medical Decision Making Amount and/or Complexity of Data Reviewed Labs: ordered. Radiology: ordered.    MDM  This is a 56 y.o. female who presents to the ED with dizziness The differential of this patient includes but is not limited to vertigo, posterior circulating stroke, SAH, dehydration, hypo or hyperglycemia, HTN emergency, anemia, arrhythmia, cardiac etiology, electrolyte abnormality, infection, medication related  My Impression, Plan, and ED Course:  Presents here with dizziness symptoms that started this morning.  She has associated headache and blurry vision.  She was found to be hypertensive on the scene when EMS picked her up, but has since had normal blood pressures since being here.   Symptoms are persistent but do not seem consistent with vertigo.  She had a completely normal neurological exam, so I doubt that this is representative of a posterior circulating stroke at this time.   She does have a headache which is abnormal for her so we will obtain CT imaging of her head.   Screening EKG obtained.  She is not having any chest pain, so I think ACS is unlikely.   This is an atypical presentation for pulmonary embolism, and with no other symptoms to suggest PE, I think this is less likely.   Symptoms seem consistent with possible orthostatic cause.  We obtained orthostatics here which were normal. We obtained Orthostatics here which were negative.  IV fluids provided  I personally ordered, reviewed, and interpreted all laboratory work and imaging and agree with radiologist interpretation. Results interpreted below:  CBC is reassuring with no abnormalities.  CMP  reveals no electrolyte abnormalities, kidney function, glucose is 105.  CT head did not reveal any acute abnormalities suggestive of subarachnoid or intracranial bleeding.  '@1045'$ , on reassessment, patient states that her symptoms have completely resolved.  She is no longer having any blurry vision.  She has not had recurrence of her symptoms.  At this point, I really doubt this is a neurological cause.  I think this is most likely due to dehydration and orthostasis.  I do not think patient requires any further work-up at this point.  She will follow-up with her primary care doctor.  I have given her return precautions.  Charting Requirements Additional history is obtained from:  Independent historian External Records from outside source obtained and reviewed including: Reviewed prior labs and EKG Social Determinants of Health:  none Pertinant PMH that complicates patient's illness: HTN  Patient Care Problems that were addressed during this visit: - Dizziness: Acute illness with systemic symptoms This patient was maintained on a cardiac monitor/telemetry. I personally viewed and interpreted the cardiac monitor which reveals an underlying rhythm of NSR Medications given in ED: IVF 1 L bolus Reevaluation of the patient after these medicines showed that the patient resolved I have reviewed home medications and no changes made. Disposition: discharge, f/u with PCP  This is a shared visit with my attending physician, Dr. Langston Masker.  We have discussed this patient and they have independently evaluated this patient. The plan was altered or changed as needed.  Portions of this note were generated with Lobby[employer]. Dictation errors may occur despite best attempts at proofreading.    Final Clinical Impression(s) / ED Diagnoses Final diagnoses:  Dizziness  Rx / DC Orders ED Discharge Orders     None         Adolphus Birchwood, PA-C 03/31/22 1054    Wyvonnia Dusky,  MD 03/31/22 1356

## 2022-03-31 NOTE — ED Triage Notes (Signed)
Pt arrives via [facility] from work with dizziness, blurred vision and HTN. 190/110 even after taking medications. 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

## 2022-04-11 ENCOUNTER — Other Ambulatory Visit: Payer: Self-pay

## 2022-04-11 ENCOUNTER — Ambulatory Visit (HOSPITAL_BASED_OUTPATIENT_CLINIC_OR_DEPARTMENT_OTHER): Payer: 59 | Attending: Internal Medicine | Admitting: Internal Medicine

## 2022-04-11 DIAGNOSIS — Z6841 Body Mass Index (BMI) 40.0 and over, adult: Secondary | ICD-10-CM | POA: Insufficient documentation

## 2022-04-11 DIAGNOSIS — R0683 Snoring: Secondary | ICD-10-CM | POA: Insufficient documentation

## 2022-04-11 DIAGNOSIS — R682 Dry mouth, unspecified: Secondary | ICD-10-CM | POA: Diagnosis not present

## 2022-04-12 ENCOUNTER — Encounter (HOSPITAL_BASED_OUTPATIENT_CLINIC_OR_DEPARTMENT_OTHER): Payer: Self-pay | Admitting: *Deleted

## 2022-04-14 DIAGNOSIS — R0683 Snoring: Secondary | ICD-10-CM | POA: Diagnosis not present

## 2022-04-14 NOTE — Procedures (Signed)
    Patient Name: Sarah Singh, Sarah Singh Date: 04/11/2022 Gender: Female D.O.B: [birth year] Age (years): 56 Referring Provider: Mack Hook Height (inches): 62 Interpreting Physician: Baird Lyons MD, ABSM Weight (lbs): 232 RPSGT: Gwenyth Allegra BMI: 42 MRN: [id number] Neck Size: 14.00  CLINICAL INFORMATION Sleep Study Type: NPSG Indication for sleep study: Hypertension Epworth Sleepiness Score: 3  SLEEP STUDY TECHNIQUE As per the AASM Manual for the Scoring of Sleep and Associated Events v2.3 (April 2016) with a hypopnea requiring 4% desaturations.  The channels recorded and monitored were frontal, central and occipital EEG, electrooculogram (EOG), submentalis EMG (chin), nasal and oral airflow, thoracic and abdominal wall motion, anterior tibialis EMG, snore microphone, electrocardiogram, and pulse oximetry.  MEDICATIONS Medications self-administered by patient taken the night of the study : none reported  SLEEP ARCHITECTURE The study was initiated at 11:05:32 PM and ended at 5:09:08 AM.  Sleep onset time was 6.3 minutes and the sleep efficiency was 76.5%%. The total sleep time was 278.3 minutes.  Stage REM latency was 125.0 minutes.  The patient spent 15.3%% of the night in stage N1 sleep, 65.5%% in stage N2 sleep, 0.0%% in stage N3 and 19.2% in REM.  Alpha intrusion was absent.  Supine sleep was 0.00%.  RESPIRATORY PARAMETERS The overall apnea/hypopnea index (AHI) was 21.8 per hour. There were 0 total apneas, including 0 obstructive, 0 central and 0 mixed apneas. There were 101 hypopneas and 22 RERAs.  The AHI during Stage REM sleep was 61.7 per hour.  AHI while supine was N/A per hour.  The mean oxygen saturation was 93.7%. The minimum SpO2 during sleep was 67.0%.  moderate snoring was noted during this study.  CARDIAC DATA The 2 lead EKG demonstrated sinus rhythm. The mean heart rate was 58.8 beats per minute. Other EKG findings include: None.  LEG  MOVEMENT DATA The total PLMS were 0 with a resulting PLMS index of 0.0. Associated arousal with leg movement index was 1.1 .  IMPRESSIONS - Moderate obstructive sleep apnea occurred during this study (AHI = 21.8/h). - Severe oxygen desaturation was noted during this study (Min O2 = 67.0%). Mean 93.7%. Time with O2 saturation 88% or less was 12.5 minutes. - The patient snored with moderate snoring volume. - No cardiac abnormalities were noted during this study. - Occasional periodic limb movements did occur during sleep. No significant associated arousals.  DIAGNOSIS - Obstructive Sleep Apnea (G47.33)  RECOMMENDATIONS - Suggest CPAP titration sleep study or autopap. Other options would be based on clinical judgment. - Be careful with alcohol, sedatives and other CNS depressants that may worsen sleep apnea and disrupt normal sleep architecture. - Sleep hygiene should be reviewed to assess factors that may improve sleep quality. - Weight management and regular exercise should be initiated or continued if appropriate.  [Electronically signed] 04/14/2022 01:03 PM  Baird Lyons MD, [place], American Board of Sleep Medicine NPI: [id number]                         [facility], [facility]  ELECTRONICALLY SIGNED ON:  04/14/2022, 12:48 PM [place] PH: [phone]   FX: [phone] [place]

## 2022-05-07 ENCOUNTER — Ambulatory Visit (HOSPITAL_COMMUNITY)
Admission: EM | Admit: 2022-05-07 | Discharge: 2022-05-07 | Disposition: A | Discharge status: Home / Self Care | Payer: Commercial Managed Care - HMO | Attending: Internal Medicine | Admitting: Internal Medicine

## 2022-05-07 ENCOUNTER — Encounter (HOSPITAL_COMMUNITY): Payer: Self-pay

## 2022-05-07 DIAGNOSIS — K047 Periapical abscess without sinus: Secondary | ICD-10-CM | POA: Diagnosis not present

## 2022-05-07 DIAGNOSIS — K0889 Other specified disorders of teeth and supporting structures: Secondary | ICD-10-CM | POA: Diagnosis not present

## 2022-05-07 MED ORDER — IBUPROFEN 800 MG PO TABS: 800.0000 mg | ORAL_TABLET | Freq: Three times a day (TID) | ORAL | 0 refills | Status: DC

## 2022-05-07 MED ORDER — AMOXICILLIN-POT CLAVULANATE 875-125 MG PO TABS: 1.0000 | ORAL_TABLET | Freq: Two times a day (BID) | ORAL | 0 refills | Status: DC

## 2022-05-07 MED ORDER — LIDOCAINE VISCOUS HCL 2 % MT SOLN
15.0000 mL | OROMUCOSAL | 0 refills | Status: DC | PRN
Start: 2022-05-07 — End: 2022-08-27

## 2022-05-07 NOTE — ED Triage Notes (Signed)
Pt presents to the office for dental pain. She would like a list of dental offices in the clinic.

## 2022-05-07 NOTE — ED Provider Notes (Addendum)
[place]    CSN: [id number] Arrival date & time: 05/07/22  1114      History   Chief Complaint No chief complaint on file.   HPI Sarah Singh is a 56 y.o. female.   Patient presents to urgent care for evaluation of her left-sided dental pain that has been ongoing for the last few days.  Pain is to the left lower posterior aspect of her mouth to her back tooth.  She has a history of dental infections and has had teeth removed in the past and the area of pain.  Patient has attempted use of BC Goody powders topically to her tooth as well as orally without much relief of pain.  Pain to the left mouth is currently an 8 on a scale of 0-10.  She has also attempted use of over-the-counter dental pain relief spray without much improvement of symptoms.  Patient is requesting a list of community dentists that are low cost for patients without dental insurance for further evaluation.  Denies difficulty swallowing, sore throat, bilateral ear pain, headache, nasal drainage, dizziness, fever/chills, and neck pain.     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 Authorizing Provider  amoxicillin-clavulanate (AUGMENTIN) 875-125 MG tablet Take 1 tablet by mouth every 12 (twelve) hours. 05/07/22  Yes Talbot Grumbling, FNP  ibuprofen (ADVIL) 800 MG tablet Take 1 tablet (800 mg total) by mouth 3 (three) times daily. 05/07/22  Yes Talbot Grumbling, FNP  lidocaine (XYLOCAINE) 2 % solution Use as directed 15 mLs in the mouth or throat every 4 (four) hours as needed for mouth pain. 05/07/22  Yes Talbot Grumbling, FNP  Calcium-Magnesium-Vitamin D 300-20-200 MG-MG-UNIT CHEW 2 chews by mouth twice daily. 08/19/21   Mack Hook, MD  cyclobenzaprine (FLEXERIL) 5 MG tablet Take 1 tablet (5 mg total) by mouth 3 (three) times daily as needed for muscle spasms. 03/25/22   Carvel Getting, NP  hydrochlorothiazide (HYDRODIURIL) 25 MG tablet Take 1 tablet (25 mg total) by mouth daily. 08/19/21   Mack Hook, MD  lisinopril (ZESTRIL) 10 MG tablet Take 1 tablet (10 mg total) by mouth daily. 08/27/21   Mack Hook, MD  Multiple Vitamins-Minerals (MULTIVITAMIN ADULTS 50+ PO) Take 1 tablet by mouth daily. Patient not taking: Reported on 03/31/2022    [provider]  triamcinolone ointment (KENALOG) 0.5 % Apply 1 application topically at bedtime. Patient not taking: Reported on 03/31/2022 10/15/21   Landis Martins, DPM    Family History Family History  Problem Relation Age of Onset   Hypertension Mother    Cancer Father    Diabetes Sister    Esophageal cancer Brother    Cancer Brother 11       throat cancer    Colon cancer Neg Hx    Colon polyps Neg Hx    Rectal cancer Neg Hx    Stomach cancer Neg Hx     Social History Social History    Tobacco Use   Smoking status: Former    Types: Cigarettes    Start date: 03/08/1980    Quit date: 03/08/2013    Years since quitting: 9.1   Smokeless tobacco: Never  Vaping Use   Vaping Use: Never used  Substance Use Topics   Alcohol use: No    Comment: History of abuse.  Stopped in 2010   Drug use: No    Types: Cocaine    Comment: no use since 2010.      Allergies   Other   Review of Systems Review of Systems Per HPI  Physical Exam Triage Vital Signs ED Triage Vitals [05/07/22 1200]  Enc Vitals Group     BP 121/82     Pulse Rate 67     Resp 16     Temp 98.1 F (36.7 C)     Temp Source Oral     SpO2 98 %     Weight      Height      Head Circumference      Peak Flow      Pain Score      Pain Loc      Pain Edu?      Excl. in [place]?    No data found.  Updated Vital Signs BP 121/82 (BP Location: Left Arm)   Pulse 67   Temp 98.1 F (36.7 C) (Oral)   Resp 16   LMP  (LMP Unknown)   SpO2 98%   Visual Acuity Right Eye Distance:   Left Eye Distance:   Bilateral Distance:    Right Eye Near:   Left Eye Near:    Bilateral Near:     Physical Exam Vitals and nursing note reviewed.  Constitutional:      Appearance: Normal appearance. She is not ill-appearing or toxic-appearing.     Comments: Very pleasant patient sitting on exam in position of comfort table in no acute distress.   HENT:     Head: Normocephalic and atraumatic.     Right Ear: Hearing and external ear normal.     Left Ear: Hearing and external ear normal.     Nose: Nose normal.     Mouth/Throat:     Lips: Pink.     Mouth: Mucous membranes are moist.     Dentition: Abnormal dentition. Dental tenderness present.     Tongue: No lesions.     Palate: No mass.     Pharynx: Oropharynx  is clear. Uvula midline. No posterior oropharyngeal erythema.     Tonsils: No tonsillar exudate or tonsillar abscesses.     Comments: Patient is missing multiple teeth to the lower right aspect of her mouth.  Tooth  outlined in red above appears infected with purulent drainage and erythema surrounding tooth.  Oral mucosa on the left side of mouth appears swollen and erythematous.  No palpable abscess.  Airway is intact.  Patient able to maintain secretions without difficulty. Eyes:     General: Lids are normal. Vision grossly intact. Gaze aligned appropriately.     Extraocular Movements: Extraocular movements intact.     Conjunctiva/sclera: Conjunctivae normal.  Cardiovascular:     Rate and Rhythm: Normal rate and regular rhythm.     Heart sounds: Normal heart sounds, S1 normal and S2 normal.  Pulmonary:     Effort: Pulmonary effort is normal. No respiratory distress.     Breath sounds: Normal breath sounds and air entry.  Abdominal:     Palpations: Abdomen is soft.  Musculoskeletal:     Cervical back: Normal range of motion and neck supple.  Lymphadenopathy:     Cervical: Cervical adenopathy present.  Skin:    General: Skin is warm and dry.     Capillary Refill: Capillary refill takes less than 2 seconds.     Findings: No rash.  Neurological:     General: No focal deficit present.     Mental Status: She is alert and oriented to person, place, and time. Mental status is at baseline.     Cranial Nerves: No dysarthria or facial asymmetry.     Gait: Gait is intact.  Psychiatric:        Mood and Affect: Mood normal.        Speech: Speech normal.        Behavior: Behavior normal.        Thought Content: Thought content normal.        Judgment: Judgment normal.      UC Treatments / Results  Labs (all labs ordered are listed, but only abnormal results are displayed) Labs Reviewed - No data to display  EKG   Radiology No results found.  Procedures Procedures (including critical care time)  Medications Ordered in UC Medications - No data to display  Initial Impression / Assessment and Plan / UC Course  I have reviewed the triage vital signs and the nursing notes.  Pertinent labs &  imaging results that were available during my care of the patient were reviewed by me and considered in my medical decision making (see chart for details).  1.  Dental infection Patient given list of community low-cost dentists and encouraged to call them for further evaluation and possible extraction of infected tooth.  She is to take Augmentin twice daily for the next 7 days.  Patient denies allergies to medications.  Recommend ice to the left side of her mouth 20 minutes on 20 minutes off to reduce swelling and inflammation as well as pain.  She may use lidocaine solution to the mouth every 4 hours as needed for mouth pain (instructed to swish and spit lidocaine, do not swallow).  Patient instructed to take 800 mg ibuprofen every 8 hours as needed for dental pain and inflammation.  She is to stop taking Goody powders while taking ibuprofen and avoid all other NSAID containing medications while taking ibuprofen for tooth pain.   Discussed physical exam and available lab work findings in clinic with patient.  Counseled patient regarding appropriate use of medications and potential side effects for all medications recommended or prescribed today. Discussed red flag signs and symptoms of worsening condition,when to call the PCP office, return to urgent care, and when to seek higher level of care in the emergency department. Patient verbalizes understanding and agreement with plan. All questions answered. Patient discharged in stable condition.  Final Clinical Impressions(s) / UC Diagnoses   Final diagnoses:  Dental infection  Pain, dental     Discharge Instructions      Call your dentist on the list of dentists provided for further evaluation of your dental infection.  Take Augmentin antibiotic twice daily for the next 7 days to treat dental infection.  Apply ice to the left cheek 20 minutes on 20 minutes off to further reduce inflammation associated with dental infection.  You may use  lidocaine solution to your mouth every 4 hours as needed for mouth pain.  Swish this solution in your mouth and spit it out.  Do not swallow the lidocaine.   Take ibuprofen 800 mg every 8 hours as needed for dental pain and inflammation.  Do not take any other NSAID containing medications while taking ibuprofen such as Goody powders, aspirin, naproxen, and Aleve.  If your symptoms worsen or do not improve in the next 3 to 4 days while taking the antibiotic, please return to urgent care or go to the emergency room if your symptoms are severe.  I hope you feel better!     ED Prescriptions     Medication Sig Dispense Auth. Provider   amoxicillin-clavulanate (AUGMENTIN) 875-125 MG tablet Take 1 tablet by mouth every 12 (twelve) hours. 14 tablet Joella Prince M, FNP   ibuprofen (ADVIL) 800 MG tablet Take 1 tablet (800 mg total) by mouth 3 (three) times daily. 21 tablet Talbot Grumbling, FNP   lidocaine (XYLOCAINE) 2 % solution Use as directed 15 mLs in the mouth or throat every 4 (four) hours as needed for mouth pain. 100 mL Talbot Grumbling, FNP      PDMP not reviewed this encounter.   Talbot Grumbling, FNP 05/07/22 1304    Talbot Grumbling, FNP 05/07/22 [id number]

## 2022-05-07 NOTE — Discharge Instructions (Signed)
Call your dentist on the list of dentists provided for further evaluation of your dental infection.  Take Augmentin antibiotic twice daily for the next 7 days to treat dental infection.  Apply ice to the left cheek 20 minutes on 20 minutes off to further reduce inflammation associated with dental infection.  You may use lidocaine solution to your mouth every 4 hours as needed for mouth pain.  Swish this solution in your mouth and spit it out.  Do not swallow the lidocaine.   Take ibuprofen 800 mg every 8 hours as needed for dental pain and inflammation.  Do not take any other NSAID containing medications while taking ibuprofen such as Goody powders, aspirin, naproxen, and Aleve.  If your symptoms worsen or do not improve in the next 3 to 4 days while taking the antibiotic, please return to urgent care or go to the emergency room if your symptoms are severe.  I hope you feel better!

## 2022-06-15 ENCOUNTER — Encounter (HOSPITAL_COMMUNITY): Payer: Self-pay | Admitting: Emergency Medicine

## 2022-06-15 ENCOUNTER — Ambulatory Visit (HOSPITAL_COMMUNITY)
Admission: EM | Admit: 2022-06-15 | Discharge: 2022-06-15 | Disposition: A | Discharge status: Home / Self Care | Payer: Commercial Managed Care - HMO | Attending: Physician Assistant | Admitting: Physician Assistant

## 2022-06-15 DIAGNOSIS — R0789 Other chest pain: Secondary | ICD-10-CM

## 2022-06-15 DIAGNOSIS — S29011A Strain of muscle and tendon of front wall of thorax, initial encounter: Secondary | ICD-10-CM

## 2022-06-15 MED ORDER — IBUPROFEN 600 MG PO TABS: 600.0000 mg | ORAL_TABLET | Freq: Three times a day (TID) | ORAL | 0 refills | Status: DC

## 2022-06-15 MED ORDER — TIZANIDINE HCL 4 MG PO TABS: 4.0000 mg | ORAL_TABLET | Freq: Four times a day (QID) | ORAL | 0 refills | Status: DC | PRN

## 2022-06-15 NOTE — ED Triage Notes (Signed)
Chest pain starting when she went to work today, central/right sided, constant in nature. Not worsened with breathing, pressing over site makes it worse, no worse with movements. Denies SOB, nausea, left arm pain, neck pain, jaw pain, fever, chills.  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 Reports pertinent PMH of HTN, which she took her meds for this morning.

## 2022-06-15 NOTE — Discharge Instructions (Signed)
Advised to apply ice to the area, 10 minutes on 20 minutes off, 3-4 times throughout the day to help decrease pain. Advised take ibuprofen 600 mg 1 every 8 hours on a regular basis for the next several days to decrease the chest wall pain. Advised take Zanaflex 1 every 6-8 hours as needed to help reduce muscle spasm from the chest wall. Advised to follow-up with PCP over return to urgent care if symptoms fail to improve.

## 2022-06-15 NOTE — ED Notes (Signed)
EKG shown to C. Lavone Orn, NP with report given.

## 2022-06-15 NOTE — ED Provider Notes (Addendum)
[place]    CSN: [id number] Arrival date & time: 06/15/22  1000      History   Chief Complaint No chief complaint on file.   HPI Sarah Singh is a 56 y.o. female.   56 year old female presents with chest wall pain.  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  Patient indicates that it hurts when she breathes deep, it hurts when she moves her right arm when reaching around to the right and pulling items.  Patient indicates she is not having shortness of breath, no known injury to the area.  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  She indicates she has not taken any medicine to help relieve the pain since it just started this morning.  Patient denies fever or chills.     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 Authorizing Provider  ibuprofen (ADVIL) 600 MG tablet Take 1 tablet (600 mg total) by mouth 3 (three) times daily. 06/15/22  Yes Nyoka Lint, PA-C  tiZANidine (ZANAFLEX) 4 MG tablet Take 1 tablet (4 mg total) by mouth every 6 (six) hours as needed for muscle spasms. 06/15/22  Yes Nyoka Lint, PA-C  amoxicillin-clavulanate (AUGMENTIN) 875-125 MG tablet Take 1 tablet by mouth every 12 (twelve) hours. 05/07/22   Talbot Grumbling, FNP  Calcium-Magnesium-Vitamin D 300-20-200 MG-MG-UNIT CHEW 2 chews by mouth twice daily. 08/19/21   Mack Hook, MD  cyclobenzaprine (FLEXERIL) 5 MG tablet Take 1 tablet (5 mg total) by mouth 3 (three) times daily as needed for muscle spasms. 03/25/22   Carvel Getting, NP  hydrochlorothiazide (HYDRODIURIL) 25 MG tablet Take 1 tablet (25 mg total) by mouth daily. 08/19/21   Mack Hook, MD  lidocaine (XYLOCAINE) 2 % solution Use as directed 15 mLs in the mouth or throat every 4 (  four) hours as needed for mouth pain. 05/07/22   Talbot Grumbling, FNP  lisinopril (ZESTRIL) 10 MG tablet Take 1 tablet (10 mg total) by mouth daily. 08/27/21   Mack Hook, MD  Multiple Vitamins-Minerals (MULTIVITAMIN ADULTS 50+ PO) Take 1 tablet by mouth daily. Patient not taking: Reported on 03/31/2022    [provider]  triamcinolone ointment (KENALOG) 0.5 % Apply 1 application topically at bedtime. Patient not taking: Reported on 03/31/2022 10/15/21   Landis Martins, DPM    Family History Family History  Problem Relation Age of Onset   Hypertension Mother    Cancer Father    Diabetes Sister    Esophageal cancer Brother    Cancer Brother 14       throat cancer    Colon cancer Neg Hx    Colon polyps Neg Hx    Rectal cancer Neg Hx    Stomach cancer Neg Hx     Social History Social History   Tobacco Use   Smoking status: Former    Types: Cigarettes     Start date: 03/08/1980    Quit date: 03/08/2013    Years since quitting: 9.2   Smokeless tobacco: Never  Vaping Use   Vaping Use: Never used  Substance Use Topics   Alcohol use: No    Comment: History of abuse.  Stopped in 2010   Drug use: No    Types: Cocaine    Comment: no use since 2010.      Allergies   Other   Review of Systems Review of Systems  Cardiovascular:  Positive for chest pain (wall pain on the right).     Physical Exam Triage Vital Signs ED Triage Vitals  Enc Vitals Group     BP 06/15/22 1016 116/66     Pulse Rate 06/15/22 1016 66     Resp 06/15/22 1016 14     Temp 06/15/22 1016 97.9 F (36.6 C)     Temp Source 06/15/22 1016 Oral     SpO2 06/15/22 1016 99 %     Weight --      Height --      Head Circumference --      Peak Flow --      Pain Score 06/15/22 1018 7     Pain Loc --      Pain Edu? --      Excl. in [place]? --    No data found.  Updated Vital Signs BP 116/66 (BP Location: Right Arm)   Pulse 66   Temp 97.9 F (36.6 C) (Oral)   Resp 14   LMP  (LMP Unknown)   SpO2 99%   Visual Acuity Right Eye Distance:   Left Eye Distance:   Bilateral Distance:    Right Eye Near:   Left Eye Near:    Bilateral Near:     Physical Exam Constitutional:      Appearance: Normal appearance.  Cardiovascular:     Rate and Rhythm: Normal rate and regular rhythm.     Heart sounds: Normal heart sounds.  Pulmonary:     Effort: Pulmonary effort is normal.     Breath sounds: Normal breath sounds and air entry. No wheezing, rhonchi or rales.  Chest:       Comments: Chest wall: There is tenderness on palpation of the right upper mid chest wall, no crepitus, no rash or swelling. Musculoskeletal:     Comments: Right shoulder: Full range of motion is intact, resistance abduction  reproduces the right chest wall pain.  Strength is intact, stability is intact.  Neurological:     Mental Status: She is alert.      UC Treatments / Results  Labs (all labs  ordered are listed, but only abnormal results are displayed) Labs Reviewed - No data to display  EKG: No acute changes on EKG, normal.   Radiology No results found.  Procedures Procedures (including critical care time)  Medications Ordered in UC Medications - No data to display  Initial Impression / Assessment and Plan / UC Course  I have reviewed the triage vital signs and the nursing notes.  Pertinent labs & imaging results that were available during my care of the patient were reviewed by me and considered in my medical decision making (see chart for details).    Plan: 1.  Patient advised take ibuprofen 600 mg 1 every 8 hours with food. 2.  Advised to take the Zanaflex 1 every 6-8 hours as needed to help prevent muscle spasm. 3.  Advised to put ice to the area, 10 minutes on 20 minutes off, 3-4 times throughout the day to help reduce the pain. 4.  Advised to follow-up with PCP or return to urgent care if symptoms fail to improve. Final Clinical Impressions(s) / UC Diagnoses   Final diagnoses:  Chest wall pain  Muscle strain of chest wall, initial encounter     Discharge Instructions      Advised to apply ice to the area, 10 minutes on 20 minutes off, 3-4 times throughout the day to help decrease pain. Advised take ibuprofen 600 mg 1 every 8 hours on a regular basis for the next several days to decrease the chest wall pain. Advised take Zanaflex 1 every 6-8 hours as needed to help reduce muscle spasm from the chest wall. Advised to follow-up with PCP over return to urgent care if symptoms fail to improve.    ED Prescriptions     Medication Sig Dispense Auth. Provider   ibuprofen (ADVIL) 600 MG tablet Take 1 tablet (600 mg total) by mouth 3 (three) times daily. 21 tablet Nyoka Lint, PA-C   tiZANidine (ZANAFLEX) 4 MG tablet Take 1 tablet (4 mg total) by mouth every 6 (six) hours as needed for muscle spasms. 30 tablet Nyoka Lint, PA-C      PDMP not reviewed  this encounter.   Nyoka Lint, PA-C 06/15/22 1208    Nyoka Lint, PA-C 06/15/22 1642

## 2022-06-28 ENCOUNTER — Telehealth: Payer: Self-pay | Admitting: Internal Medicine

## 2022-06-28 DIAGNOSIS — G4730 Sleep apnea, unspecified: Secondary | ICD-10-CM

## 2022-06-28 DIAGNOSIS — Z780 Asymptomatic menopausal state: Secondary | ICD-10-CM

## 2022-06-29 NOTE — Telephone Encounter (Signed)
Patient has been notified of the order sent. Patient instructed to call if she has not heard from anyone in the next week.

## 2022-07-08 ENCOUNTER — Ambulatory Visit (HOSPITAL_COMMUNITY)
Admission: EM | Admit: 2022-07-08 | Discharge: 2022-07-08 | Disposition: A | Discharge status: Home / Self Care | Payer: Commercial Managed Care - HMO | Attending: Emergency Medicine | Admitting: Emergency Medicine

## 2022-07-08 ENCOUNTER — Encounter (HOSPITAL_COMMUNITY): Payer: Self-pay | Admitting: *Deleted

## 2022-07-08 DIAGNOSIS — M109 Gout, unspecified: Secondary | ICD-10-CM | POA: Diagnosis not present

## 2022-07-08 MED ORDER — INDOMETHACIN 50 MG PO CAPS: 50.0000 mg | ORAL_CAPSULE | Freq: Three times a day (TID) | ORAL | 0 refills | Status: AC

## 2022-07-08 NOTE — Discharge Instructions (Signed)
Take indomethacin, a strong anti-inflammatory pain medicine, to help with the pain and swelling in your foot. Recommend follow-up with PCP or ortho if no improvement after medication.

## 2022-07-08 NOTE — ED Provider Notes (Signed)
[place]    CSN: [id number] Arrival date & time: 07/08/22  1324      History   Chief Complaint Chief Complaint  Patient presents with   Foot Pain    HPI Sarah Singh is a 56 y.o. female.   Patient presents with concerns of right foot pain. She states she noticed it was hurting this morning when she woke up. She went to work which requires her to stand and the pain worsened the longer she was there. The patient denies any injury or recent change in activity. She states the area on the top side of her foot is painful, tender, and swollen. She denies prior similar or known issues with her feet. She denies numbness/tingling. The patient has not taken or tried anything for it.   The history is provided by the patient.  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 Authorizing Provider  hydrochlorothiazide (HYDRODIURIL) 25 MG tablet Take 1 tablet (25 mg total) by mouth daily. 08/19/21  Yes Mack Hook, MD  indomethacin (INDOCIN) 50 MG capsule Take 1 capsule (50 mg total) by mouth 3 (three) times daily with meals for 5 days. 07/08/22 07/13/22 Yes Jahnya Trindade L, PA  lisinopril (ZESTRIL) 10 MG tablet Take 1 tablet (10 mg total) by mouth daily. 08/27/21  Yes Mack Hook, MD  Multiple Vitamins-Minerals (MULTIVITAMIN ADULTS 50+ PO) Take 1 tablet by mouth daily.   Yes [provider]  amoxicillin-clavulanate (AUGMENTIN) 875-125 MG tablet Take 1 tablet by mouth every 12 (twelve) hours. 05/07/22   Talbot Grumbling, FNP  Calcium-Magnesium-Vitamin D 300-20-200 MG-MG-UNIT CHEW 2 chews by mouth twice daily. 08/19/21   Mack Hook, MD  cyclobenzaprine (FLEXERIL) 5 MG tablet Take 1 tablet (5 mg total) by mouth 3 (three) times daily as needed for muscle spasms. 03/25/22   Carvel Getting, NP  lidocaine (XYLOCAINE) 2 % solution Use as directed 15 mLs in  the mouth or throat every 4 (four) hours as needed for mouth pain. 05/07/22   Talbot Grumbling, FNP  tiZANidine (ZANAFLEX) 4 MG tablet Take 1 tablet (4 mg total) by mouth every 6 (six) hours as needed for muscle spasms. 06/15/22   Nyoka Lint, PA-C  triamcinolone ointment (KENALOG) 0.5 % Apply 1 application topically at bedtime. Patient not taking: Reported on 03/31/2022 10/15/21   Landis Martins, DPM    Family History Family History  Problem Relation Age of Onset   Hypertension Mother    Cancer Father    Diabetes Sister    Esophageal cancer Brother    Cancer Brother 38       throat cancer    Colon cancer Neg Hx    Colon polyps Neg Hx    Rectal cancer Neg Hx    Stomach cancer Neg Hx     Social History Social History   Tobacco Use   Smoking status: Former    Types: Cigarettes    Start date: 03/08/1980    Quit date: 03/08/2013    Years since quitting: 9.3    Smokeless tobacco: Never  Vaping Use   Vaping Use: Never used  Substance Use Topics   Alcohol use: No    Comment: History of abuse.  Stopped in 2010   Drug use: No    Types: Cocaine    Comment: no use since 2010.      Allergies   Other   Review of Systems Review of Systems  Constitutional:  Negative for fever.  Cardiovascular:  Negative for leg swelling.  Musculoskeletal:  Positive for arthralgias, gait problem and joint swelling.  Skin:  Negative for color change, rash and wound.  Neurological:  Negative for weakness and numbness.     Physical Exam Triage Vital Signs ED Triage Vitals  Enc Vitals Group     BP 07/08/22 1354 118/79     Pulse Rate 07/08/22 1354 80     Resp 07/08/22 1354 18     Temp 07/08/22 1354 98.9 F (37.2 C)     Temp src --      SpO2 07/08/22 1354 96 %     Weight --      Height --      Head Circumference --      Peak Flow --      Pain Score 07/08/22 1352 8     Pain Loc --      Pain Edu? --      Excl. in [place]? --    No data found.  Updated Vital Signs BP 118/79 (BP Location: Right Arm)   Pulse 80   Temp 98.9 F (37.2 C)   Resp 18   LMP  (LMP Unknown)   SpO2 96%   Visual Acuity Right Eye Distance:   Left Eye Distance:   Bilateral Distance:    Right Eye Near:   Left Eye Near:    Bilateral Near:     Physical Exam Vitals and nursing note reviewed.  Constitutional:      General: She is not in acute distress. HENT:     Head: Normocephalic.  Cardiovascular:     Pulses: Normal pulses.  Pulmonary:     Effort: Pulmonary effort is normal.  Musculoskeletal:     Right foot: Decreased range of motion. Swelling and tenderness present.     Comments: Tenderness and swelling to right lateral foot approx over 4th and 5th MT heads/joints. Mild decrease in ankle dorsiflexion due to  pain.   Skin:    Findings: No bruising or rash.  Neurological:     Mental Status: She is alert.     Gait: Gait abnormal (antalgic favoring right).  Psychiatric:         Mood and Affect: Mood normal.      UC Treatments / Results  Labs (all labs ordered are listed, but only abnormal results are displayed) Labs Reviewed - No data to display  EKG   Radiology No results found.  Procedures Procedures (including critical care time)  Medications Ordered in UC Medications - No data to display  Initial Impression / Assessment and Plan / UC Course  I have reviewed the triage vital signs and the nursing notes.  Pertinent labs & imaging results that were available during my care of the patient were reviewed by me and considered in my medical decision making (see chart for details).     Defer x-ray due to no mechanism of injury. Presentation consistent with gout - pt reports prior arthritis flares, not necessarily told she had gout. Empiric indomethacin and discussed return precautions.   E/M: 1 acute uncomplicated illness, no data, moderate risk due to prescription management  Final Clinical Impressions(s) / UC Diagnoses   Final diagnoses:  Acute gout of right foot, unspecified cause     Discharge Instructions      Take indomethacin, a strong anti-inflammatory pain medicine, to help with the pain and swelling in your foot. Recommend follow-up with PCP or ortho if no improvement after medication.     ED Prescriptions     Medication Sig Dispense Auth. Provider   indomethacin (INDOCIN) 50 MG capsule Take 1 capsule (50 mg total) by mouth 3 (three) times daily with meals for 5 days. 15 capsule Abner Greenspan, Noach Calvillo L, PA      PDMP not reviewed this encounter.   Delsa Sale, [place] 07/08/22 1414

## 2022-07-08 NOTE — ED Triage Notes (Signed)
Pt states that she has right foot pain woke up this morning and was hurting. She hasnt taken any meds.

## 2022-07-16 ENCOUNTER — Telehealth: Payer: Self-pay | Admitting: Internal Medicine

## 2022-07-16 ENCOUNTER — Other Ambulatory Visit: Payer: Self-pay

## 2022-07-16 MED ORDER — HYDROCHLOROTHIAZIDE 25 MG PO TABS: 25.0000 mg | ORAL_TABLET | Freq: Every day | ORAL | 4 refills | Status: DC

## 2022-07-16 NOTE — Telephone Encounter (Signed)
Pt. Called requesting refill for refill for hydrochlorothiazide 25 mg to be sent to [facility] on [address].

## 2022-07-27 ENCOUNTER — Telehealth: Payer: Self-pay

## 2022-07-27 NOTE — Telephone Encounter (Signed)
Patient would like an update on her cpap titration study order

## 2022-08-01 ENCOUNTER — Ambulatory Visit (HOSPITAL_COMMUNITY)
Admission: EM | Admit: 2022-08-01 | Discharge: 2022-08-01 | Disposition: A | Discharge status: Home / Self Care | Payer: Commercial Managed Care - HMO | Attending: Internal Medicine | Admitting: Internal Medicine

## 2022-08-01 ENCOUNTER — Encounter (HOSPITAL_COMMUNITY): Payer: Self-pay

## 2022-08-01 DIAGNOSIS — S29011A Strain of muscle and tendon of front wall of thorax, initial encounter: Secondary | ICD-10-CM

## 2022-08-01 MED ORDER — IBUPROFEN 800 MG PO TABS: 800.0000 mg | ORAL_TABLET | Freq: Three times a day (TID) | ORAL | 0 refills | Status: DC

## 2022-08-01 MED ORDER — TIZANIDINE HCL 4 MG PO TABS: 4.0000 mg | ORAL_TABLET | Freq: Four times a day (QID) | ORAL | 0 refills | Status: DC | PRN

## 2022-08-01 NOTE — ED Triage Notes (Signed)
Moved pt back to room and she states she needs a note for work tomorrow.

## 2022-08-01 NOTE — ED Triage Notes (Signed)
Chest pain onset yesterday. Right side chest pain radiating into the right shoulder. No heart racing or heart skipping beats. No heart history. Patient states it hurts when she moves and burps. No SOB or labored breathing.  Pain started after eating a burger, no medical history of reflux or heart problems.

## 2022-08-01 NOTE — ED Provider Notes (Signed)
[place]    CSN: [id number] Arrival date & time: 08/01/22  1355      History   Chief Complaint Chief Complaint  Patient presents with   Chest Pain    HPI Ramsha Lonigro is a 56 y.o. female.   Right sided chest pain started yesterday after she ate a cheeseburger.  Works at [employer] and states that she didn't go to work yesterday to pick up heavy items but she worked Friday.  Has to lift heavy items at work.   This has happened before.   No recent URI symptoms.  Nausea, vomiting diarrhea, abdominal pain, or dizziness.   Goes up into the right anterior neck a little bit Doesn't go down right arm  She took ibuprofen '800mg'$   Didn't help yesterday  Hasn't tried heat  Doesn't hurt when she moves her arm Rull ROM of the right upper extremity   Chest Pain   Past Medical History:  Diagnosis Date   Arthritis    knees and hands   Essential hypertension 07/25/2018   Prediabetes 12/18/2014   Vitamin D deficiency 2016    Patient Active Problem List   Diagnosis Date Noted   Snoring 02/25/2022   Chronic dental pain 02/25/2022   Chronic pain of both knees 08/19/2021   Right foot pain 08/19/2021   Lipoma of left upper extremity 10/09/2019   Class 3 severe obesity with serious comorbidity and body mass index (BMI) of 40.0 to 44.9 in adult ([place]) 06/24/2019   Decreased visual acuity 10/29/2018   Guaiac + stool 10/29/2018   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 Authorizing Provider  hydrochlorothiazide (HYDRODIURIL) 25 MG tablet Take 1 tablet (25 mg total) by mouth daily. 07/16/22  Yes Mack Hook, MD  lisinopril (ZESTRIL) 10 MG tablet Take 1 tablet (10 mg total) by mouth daily. 08/27/21  Yes Mack Hook, MD  Multiple Vitamins-Minerals (MULTIVITAMIN ADULTS 50+ PO) Take 1 tablet by mouth daily.   Yes [provider]  amoxicillin-clavulanate (AUGMENTIN) 875-125 MG tablet Take 1 tablet by mouth every 12 (twelve) hours. 05/07/22   Talbot Grumbling, FNP  Calcium-Magnesium-Vitamin D 300-20-200 MG-MG-UNIT CHEW 2 chews by mouth twice daily. 08/19/21   Mack Hook, MD  cyclobenzaprine (FLEXERIL) 5 MG tablet Take 1 tablet (5 mg total) by mouth 3 (three) times daily as needed for muscle spasms. 03/25/22   Carvel Getting, NP  lidocaine (XYLOCAINE) 2 % solution Use as directed 15 mLs in the mouth or throat every 4 (four) hours as needed for mouth pain. 05/07/22   Talbot Grumbling, FNP  tiZANidine (ZANAFLEX) 4 MG tablet Take 1 tablet (4 mg total) by mouth every 6 (six) hours as needed for muscle spasms. 06/15/22   Nyoka Lint, PA-C  triamcinolone ointment (KENALOG) 0.5 % Apply 1 application topically at bedtime. Patient not taking: Reported on 03/31/2022 10/15/21   Landis Martins, DPM    Family History Family History  Problem Relation Age of Onset   Hypertension Mother    Cancer Father    Diabetes Sister    Esophageal cancer Brother    Cancer Brother 23       throat cancer    Colon cancer Neg Hx    Colon polyps Neg Hx    Rectal cancer Neg Hx    Stomach cancer Neg Hx     Social History Social History   Tobacco Use   Smoking status: Former    Types: Cigarettes    Start date: 03/08/1980    Quit date: 03/08/2013    Years since quitting: 9.4   Smokeless tobacco: Never  Vaping Use   Vaping Use: Never used  Substance Use Topics   Alcohol use: No    Comment:  History of abuse.  Stopped in 2010   Drug use: No    Types: Cocaine    Comment: no use since 2010.      Allergies   Other   Review of Systems Review of Systems  Cardiovascular:  Positive for chest pain.     Physical Exam Triage Vital Signs ED Triage Vitals  Enc Vitals Group     BP 08/01/22 1419 135/79     Pulse Rate 08/01/22 1419 71     Resp 08/01/22 1419 16     Temp --      Temp Source 08/01/22 1419 Oral     SpO2 08/01/22 1419 97 %     Weight --      Height --      Head Circumference --      Peak Flow --      Pain Score 08/01/22 1421 8     Pain Loc --      Pain Edu? --      Excl. in [place]? --    No data found.  Updated Vital Signs BP 135/79 (BP Location: Left Arm)   Pulse 71   Resp 16   LMP  (LMP Unknown)   SpO2 97%   Visual Acuity Right Eye Distance:   Left Eye Distance:   Bilateral Distance:    Right Eye Near:   Left Eye Near:    Bilateral Near:     Physical Exam   UC Treatments / Results  Labs (all labs ordered are listed, but only abnormal results are displayed) Labs Reviewed - No data to display  EKG   Radiology No results found.  Procedures Procedures (including critical care time)  Medications Ordered in UC Medications - No data to display  Initial Impression / Assessment and Plan / UC Course  I have reviewed the triage vital signs and the nursing notes.  Pertinent labs & imaging results that were available during my care of the patient were reviewed by me and considered in my medical decision making (see chart for details).     *** Final Clinical Impressions(s) / UC Diagnoses   Final diagnoses:  None   Discharge Instructions   None    ED Prescriptions   None    PDMP not reviewed this encounter.

## 2022-08-01 NOTE — Discharge Instructions (Signed)
You have been evaluated in the today for your right chest pain. Your pain is most likely muscle strain which will improve on its own with time.   Take '800mg'$  ibuprofen every 8 hours as needed for inflammation and pain with food.  You may also take zanaflex '4mg'$  muscle relaxer every 6 hours.  Do not take this medication and drive or drink alcohol as it can make you sleepy.  Mainly use this medicine at nighttime as needed.  Apply heat and perform gentle range of motion exercises to the area of greatest pain to prevent muscle stiffness and provide further pain relief.   Follow-up with your primary care provider or return to urgent care if your symptoms do not improve in the next 3 to 4 days with medications and interventions recommended today.  If you develop any new or worsening symptoms, please return to urgent care.  If your symptoms are severe, please go to the emergency room.  I hope you feel better!

## 2022-08-11 ENCOUNTER — Other Ambulatory Visit: Payer: Self-pay | Admitting: Internal Medicine

## 2022-08-19 ENCOUNTER — Encounter: Payer: Self-pay | Admitting: Internal Medicine

## 2022-08-23 ENCOUNTER — Encounter: Payer: Self-pay | Admitting: Internal Medicine

## 2022-08-27 ENCOUNTER — Encounter: Payer: Self-pay | Admitting: Internal Medicine

## 2022-08-27 ENCOUNTER — Ambulatory Visit (INDEPENDENT_AMBULATORY_CARE_PROVIDER_SITE_OTHER): Payer: Self-pay | Admitting: Internal Medicine

## 2022-08-27 VITALS — BP 100/62 | HR 72 | Resp 16 | Ht 63.75 in | Wt 246.0 lb

## 2022-08-27 DIAGNOSIS — Z6841 Body Mass Index (BMI) 40.0 and over, adult: Secondary | ICD-10-CM

## 2022-08-27 DIAGNOSIS — Z780 Asymptomatic menopausal state: Secondary | ICD-10-CM

## 2022-08-27 DIAGNOSIS — Z1231 Encounter for screening mammogram for malignant neoplasm of breast: Secondary | ICD-10-CM

## 2022-08-27 DIAGNOSIS — I10 Essential (primary) hypertension: Secondary | ICD-10-CM

## 2022-08-27 DIAGNOSIS — G4730 Sleep apnea, unspecified: Secondary | ICD-10-CM

## 2022-08-27 DIAGNOSIS — Z8619 Personal history of other infectious and parasitic diseases: Secondary | ICD-10-CM

## 2022-08-27 DIAGNOSIS — Z0000 Encounter for general adult medical examination without abnormal findings: Secondary | ICD-10-CM

## 2022-08-27 DIAGNOSIS — H538 Other visual disturbances: Secondary | ICD-10-CM

## 2022-08-27 LAB — POCT WET PREP WITH KOH
KOH Prep POC: NEGATIVE
RBC Wet Prep HPF POC: NEGATIVE
Trichomonas, UA: NEGATIVE
Yeast Wet Prep HPF POC: NEGATIVE

## 2022-08-27 NOTE — Progress Notes (Signed)
Subjective:    Patient ID: Sarah Singh, female   DOB: 1966-09-02, 56 y.o.   MRN: [id number]   HPI  CPE without pap  1.  Pap:  Last 08/19/2021 and normal.    2.  Mammogram:  Last performed 09/2021 and normal.    3.  Osteoprevention:  No family history of osteoporosis.  Taking adequate calcium and Vitamin D.  Post menopausal since age 72  4.  Guaiac Cards/FIT:  Never.    5.  Colonoscopy:  Screening colonoscopy 12/08/2021 with Dr. Karmen Bongo.  2 hyperplastic polyps with repeat recommended in 10 years.    6.  Immunizations:   Immunization History  Administered Date(s) Administered   Influenza Inj Mdck Quad Pf 10/27/2018   Influenza-Unspecified 07/02/2021, 07/02/2022   PFIZER(Purple Top)SARS-COV-2 Vaccination 01/15/2020, 02/05/2020   Pfizer Covid-19 Vaccine Bivalent Booster 21yr & up 08/19/2021   Tdap 07/24/2018   Zoster Recombinat (Shingrix) 07/31/2021, 10/22/2021     7.  Glucose/Cholesterol:  Prediabetes with A1C or 6.4% last year.  Cholesterol mildly high as well with LDL of 141.  She has tried to make changes with diet and physical activity, but weight still increasing.    Current Meds  Medication Sig   Calcium-Magnesium-Vitamin D 300-20-200 MG-MG-UNIT CHEW 2 chews by mouth twice daily.   hydrochlorothiazide (HYDRODIURIL) 25 MG tablet Take 1 tablet (25 mg total) by mouth daily.   lisinopril (ZESTRIL) 10 MG tablet Take 1 tablet by mouth once daily   Multiple Vitamins-Minerals (MULTIVITAMIN ADULTS 50+ PO) Take 1 tablet by mouth daily.   tiZANidine (ZANAFLEX) 4 MG tablet Take 1 tablet (4 mg total) by mouth every 6 (six) hours as needed for muscle spasms.   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   Past Medical History:  Diagnosis Date   Arthritis    knees and hands   Essential hypertension 07/25/2018   Prediabetes 12/18/2014   Sleep apnea, obstructive 2023   Vitamin D deficiency 2016   Past Surgical  History:  Procedure Laterality Date    TM tubes      TUBAL LIGATION  1992   Family History  Problem Relation Age of Onset   Arthritis Mother    Hypertension Mother    Cancer Father    Diabetes Sister    Esophageal cancer Brother    Cancer Brother 577      throat cancer    Colon cancer Neg Hx    Colon polyps Neg Hx    Rectal cancer Neg Hx    Stomach cancer Neg Hx    Social History   Socioeconomic History   Marital status: Single    Spouse name: Long term boyfriend   Number of children: 2   Years of education: 11   Highest education level: Not on file  Occupational History   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  Stopped in 2010   Drug use: Not Currently    Types: Cocaine    Comment: no use since 2010.    Sexual activity: Yes  Birth control/protection: Surgical    Comment: one female partner for 6 years.  Has recurrent STIs with him.  She is now insisting on condoms.  Other Topics Concern   Not on file  Social History Narrative   Lives in an apartment by herself   Works at [employer] down the street.   Social Determinants of Health   Financial Resource Strain: Low Risk  (08/27/2022)   Overall Financial Resource Strain (CARDIA)    Difficulty of Paying Living Expenses: Not hard at all  Food Insecurity: No Food Insecurity (08/27/2022)   Hunger Vital Sign    Worried About Running Out of Food in the Last Year: Never true    Ran Out of Food in the Last Year: Never true  Transportation Needs: No Transportation Needs (08/27/2022)   PRAPARE - Administrator, Civil Service (Medical): No    Lack of Transportation (Non-Medical): No  Physical  Activity: Not on file  Stress: Not on file  Social Connections: Not on file  Intimate Partner Violence: Not on file     Review of Systems  Respiratory:  Negative for shortness of breath.   Cardiovascular:  Negative for chest pain, palpitations and leg swelling.  Genitourinary:  Negative for vaginal discharge (or itching.).      Objective:   BP 100/62 (BP Location: Left Arm, Patient Position: Sitting, Cuff Size: Large)   Pulse 72   Resp 16   Ht 5' 3.75" (1.619 m)   Wt 246 lb (111.6 kg)   LMP  (LMP Unknown)   BMI 42.56 kg/m   Physical Exam Constitutional:      Appearance: She is obese.  HENT:     Head: Normocephalic and atraumatic.     Right Ear: Tympanic membrane, ear canal and external ear normal.     Left Ear: Tympanic membrane, ear canal and external ear normal.     Nose: Nose normal.     Mouth/Throat:     Mouth: Mucous membranes are moist.     Pharynx: Oropharynx is clear.  Eyes:     Extraocular Movements: Extraocular movements intact.     Conjunctiva/sclera: Conjunctivae normal.     Pupils: Pupils are equal, round, and reactive to light.     Comments: Discs sharp  Neck:     Thyroid: No thyroid mass or thyromegaly.  Cardiovascular:     Rate and Rhythm: Normal rate and regular rhythm.     Heart sounds: S1 normal and S2 normal. No murmur heard.    No friction rub. No S3 or S4 sounds.     Comments: No carotid bruits.  Carotid, radial, femoral, DP and PT pulses normal and equal.   Pulmonary:     Effort: Pulmonary effort is normal.     Breath sounds: Normal breath sounds and air entry.  Chest:  Breasts:    Right: No inverted nipple, mass or nipple discharge.     Left: No inverted nipple, mass or nipple discharge.  Abdominal:     General: Bowel sounds are normal.     Palpations: Abdomen is soft. There is no hepatomegaly, splenomegaly or mass.     Tenderness: There is no abdominal tenderness.     Hernia: No hernia is present.  Genitourinary:    Comments:  Normal external female genitalia Scant vaginal discharge--light yellow No uterine or adnexal mass or tenderness Musculoskeletal:        General: Normal range of motion.     Cervical back: Normal range  of motion and neck supple.     Right lower leg: No edema.     Left lower leg: No edema.  Lymphadenopathy:     Head:     Right side of head: No submental or submandibular adenopathy.     Left side of head: No submental or submandibular adenopathy.     Cervical: No cervical adenopathy.     Upper Body:     Right upper body: No supraclavicular or axillary adenopathy.     Left upper body: No supraclavicular or axillary adenopathy.     Lower Body: No right inguinal adenopathy. No left inguinal adenopathy.  Skin:    General: Skin is warm.     Capillary Refill: Capillary refill takes less than 2 seconds.     Findings: No rash.  Neurological:     General: No focal deficit present.     Mental Status: She is alert and oriented to person, place, and time.     Cranial Nerves: Cranial nerves 2-12 are intact.     Sensory: Sensation is intact.     Motor: Motor function is intact.     Coordination: Coordination is intact.     Gait: Gait is intact.     Deep Tendon Reflexes: Reflexes are normal and symmetric.  Psychiatric:        Attention and Perception: Attention normal.        Mood and Affect: Mood normal.        Behavior: Behavior normal. Behavior is cooperative.      Assessment & Plan    CPE without pap Recent screening colonoscopy so no FIT  Recommend COVID vaccine at her pharmacy. Mammogram and DEXA end of Nov/beginning of Dec--pt.. postmenopausal for a number of years and history of Vitamin D deficiency.   Wet prep with BV, but asymptomatic, so will not treat.  History of GU Chlamydia--send for GC/Chlamydia.   2.  Obesity/prediabetes:  return for A1C.  Concern she is still gaining weight.  Will continue to make goals and work on diet and physical activity.    3.  Hypertension:   controlled.  Continue Lisinopril and HCTZ  4.  OSA:  Still trying to get insurance to cover Sleep study CPAP titration to confirm the correct settings that work for her rather than this home titration insurance is saying they will only cover.  Concerned a recipe for non compliance.  5.  Decreased visual acuity:  referral to Dr. Dione Booze.

## 2022-08-31 LAB — GC/CHLAMYDIA PROBE AMP
Chlamydia trachomatis, NAA: NEGATIVE
Neisseria Gonorrhoeae by PCR: NEGATIVE

## 2022-09-08 ENCOUNTER — Ambulatory Visit (HOSPITAL_COMMUNITY)
Admission: EM | Admit: 2022-09-08 | Discharge: 2022-09-08 | Disposition: A | Discharge status: Home / Self Care | Payer: Commercial Managed Care - HMO

## 2022-09-08 ENCOUNTER — Encounter (HOSPITAL_COMMUNITY): Payer: Self-pay

## 2022-09-08 DIAGNOSIS — R42 Dizziness and giddiness: Secondary | ICD-10-CM | POA: Diagnosis not present

## 2022-09-08 NOTE — Discharge Instructions (Signed)
I am glad you are feeling better.  Eat small frequent meals and drink plenty of fluid.  If you have any recurrent symptoms you need to be seen immediately so we can start the work-up we discussed today.  Follow-up with your primary care.  If anything worsens and you have severe lightheadedness, passout, feel weak, have nausea/vomiting, chest pain, shortness of breath, heart racing you need to go to the emergency room immediately.

## 2022-09-08 NOTE — ED Triage Notes (Signed)
Pt states at work and became dizzy and hot. States thought it was her b/p. States better now and will need a work note.

## 2022-09-08 NOTE — ED Provider Notes (Signed)
[place]    CSN: [id number] Arrival date & time: 09/08/22  1146      History   Chief Complaint Chief Complaint  Patient presents with   Dizziness    HPI Sarah Singh is a 56 y.o. female.   Patient presents today for evaluation following episode that occurred several hours ago while she was at work.  Reports that she was standing for prolonged period of time when she suddenly felt hot and dizzy.  She believes this is related to being overheated at work as her symptoms have since resolved.  She denies any syncopal episode, dysarthria, nausea, vomiting, chest pain, shortness of breath, weakness.  She has been able to eat and drink normally since symptoms and is feeling much better.  She denies any recent medication changes.  Has not tried any over-the-counter medication for symptom management.  Denies any recent head injury.  She is prediabetic but denies history of diabetes and does not take any insulin or other medications that would significantly impact her blood sugar.  Reports that she is feeling much better and thinks that she just needs to go home, rest, drink plenty of fluid.    Past Medical History:  Diagnosis Date   Arthritis    knees and hands   Essential hypertension 07/25/2018   Prediabetes 12/18/2014   Sleep apnea, obstructive 2023   Vitamin D deficiency 2016    Patient Active Problem List   Diagnosis Date Noted   Snoring 02/25/2022   Chronic dental pain 02/25/2022   Chronic pain of both knees 08/19/2021   Right foot pain 08/19/2021   Lipoma of left upper extremity 10/09/2019   Class 3 severe obesity with serious comorbidity and body mass index (BMI) of 40.0 to 44.9 in adult ([place]) 06/24/2019   Decreased visual acuity 10/29/2018   Guaiac + stool 10/29/2018   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 Authorizing Provider  Calcium-Magnesium-Vitamin D 300-20-200 MG-MG-UNIT CHEW 2 chews by mouth twice daily. 08/19/21   Mack Hook, MD  hydrochlorothiazide (HYDRODIURIL) 25 MG tablet Take 1 tablet (25 mg total) by mouth daily. 07/16/22   Mack Hook, MD  lisinopril (ZESTRIL) 10 MG tablet Take 1 tablet by mouth once daily 08/11/22   Mack Hook, MD  Multiple Vitamins-Minerals (MULTIVITAMIN ADULTS 50+ PO) Take 1 tablet by mouth daily.    [provider]  tiZANidine (ZANAFLEX) 4 MG tablet Take 1 tablet (4 mg total) by mouth every 6 (six) hours as needed for muscle spasms. 08/01/22   Talbot Grumbling, FNP    Family History Family History  Problem Relation Age of Onset   Arthritis Mother    Hypertension Mother    Cancer Father    Diabetes Sister    Esophageal cancer Brother    Cancer Brother 11       throat cancer    Colon cancer Neg Hx    Colon polyps Neg Hx    Rectal cancer Neg Hx    Stomach cancer Neg Hx     Social History Social History   Tobacco Use   Smoking status: Former    Types: Cigarettes    Start date: 03/08/1980    Quit date: 03/08/2013    Years since quitting: 9.5    Passive exposure: Current (Boyfriend)   Smokeless tobacco: Never  Vaping Use   Vaping Use: Never used  Substance Use Topics   Alcohol use: Not Currently    Comment: History of abuse.  Stopped in 2010   Drug use: Not Currently    Types: Cocaine    Comment: no use since 2010.      Allergies   Other   Review of Systems Review of Systems  Constitutional:   Positive for activity change. Negative for appetite change, fatigue and fever.  Respiratory:  Negative for cough and shortness of breath.   Cardiovascular:  Negative for chest pain and palpitations.  Gastrointestinal:  Negative for abdominal pain, diarrhea, nausea and vomiting.  Neurological:  Positive for dizziness. Negative for syncope, facial asymmetry, speech difficulty, weakness, light-headedness, numbness and headaches.     Physical Exam Triage Vital Signs ED Triage Vitals [09/08/22 1257]  Enc Vitals Group     BP 126/64     Pulse Rate 70     Resp 18     Temp 98.2 F (36.8 C)     Temp Source Oral     SpO2 100 %     Weight      Height      Head Circumference      Peak Flow      Pain Score 0     Pain Loc      Pain Edu?      Excl. in [place]?    No data found.  Updated Vital Signs BP 126/64 (BP Location: Right Arm)   Pulse 70   Temp 98.2 F (36.8 C) (Oral)   Resp 18   LMP  (LMP Unknown)   SpO2 100%   Visual Acuity Right Eye Distance:   Left Eye Distance:   Bilateral Distance:    Right Eye Near:   Left Eye Near:    Bilateral Near:     Physical Exam Vitals reviewed.  Constitutional:      General: She is awake. She is not in acute distress.    Appearance: Normal appearance. She is well-developed. She is not ill-appearing.     Comments: Very pleasant female appears stated age in no acute distress sitting comfortably in exam room  HENT:     Head: Normocephalic and atraumatic. No raccoon eyes, Battle's sign or contusion.     Right Ear: Tympanic membrane, ear canal and external ear normal. No hemotympanum.     Left Ear: Tympanic membrane, ear canal and external ear normal. No hemotympanum.     Nose: Nose normal.     Mouth/Throat:     Tongue: Tongue does not deviate from midline.     Pharynx: Uvula midline. No oropharyngeal exudate or posterior oropharyngeal erythema.  Eyes:     Extraocular Movements: Extraocular movements intact.  Conjunctiva/sclera:  Conjunctivae normal.     Pupils: Pupils are equal, round, and reactive to light.  Cardiovascular:     Rate and Rhythm: Normal rate and regular rhythm.     Heart sounds: Normal heart sounds, S1 normal and S2 normal. No murmur heard. Pulmonary:     Effort: Pulmonary effort is normal.     Breath sounds: Normal breath sounds. No wheezing, rhonchi or rales.     Comments: Clear to auscultation bilaterally Musculoskeletal:     Cervical back: Normal range of motion and neck supple.     Comments: Strength 5/5 bilateral upper and lower extremities  Lymphadenopathy:     Head:     Right side of head: No submental, submandibular or tonsillar adenopathy.     Left side of head: No submental, submandibular or tonsillar adenopathy.  Neurological:     General: No focal deficit present.     Mental Status: She is alert and oriented to person, place, and time.     Motor: Motor function is intact.     Coordination: Coordination is intact.     Gait: Gait is intact.     Comments: No focal neurological defect on exam.  Psychiatric:        Behavior: Behavior is cooperative.      UC Treatments / Results  Labs (all labs ordered are listed, but only abnormal results are displayed) Labs Reviewed - No data to display  EKG   Radiology No results found.  Procedures Procedures (including critical care time)  Medications Ordered in UC Medications - No data to display  Initial Impression / Assessment and Plan / UC Course  I have reviewed the triage vital signs and the nursing notes.  Pertinent labs & imaging results that were available during my care of the patient were reviewed by me and considered in my medical decision making (see chart for details).     Patient is well-appearing, afebrile, nontoxic, nontachycardic.  No focal neurological defect on exam.  Initially discussed potential work-up given lightheadedness/dizzy sensation earlier today including EKG, CBC, CMP, CBG, orthostatic vital  signs, UA.  Patient declined work-up as she reports that she is feeling her normal self and believes that she just overdid it at work.  She was provided a work excuse note but we did discuss that if she has any recurrent symptoms she would need to be evaluated immediately and we would initiate work-up to which she expressed understanding.  Discussed that if she has any syncopal episode, weakness, nausea, vomiting, chest pain, shortness of breath she needs to go to the emergency room immediately.  Strict return precautions given.  Work excuse note provided per her request.  Final Clinical Impressions(s) / UC Diagnoses   Final diagnoses:  Episodic lightheadedness     Discharge Instructions      I am glad you are feeling better.  Eat small frequent meals and drink plenty of fluid.  If you have any recurrent symptoms you need to be seen immediately so we can start the work-up we discussed today.  Follow-up with your primary care.  If anything worsens and you have severe lightheadedness, passout, feel weak, have nausea/vomiting, chest pain, shortness of breath, heart racing you need to go to the emergency room immediately.     ED Prescriptions   None    PDMP not reviewed this encounter.   Terrilee Croak, PA-C 09/08/22 1324

## 2022-09-09 ENCOUNTER — Other Ambulatory Visit: Payer: Self-pay

## 2022-09-27 ENCOUNTER — Other Ambulatory Visit (INDEPENDENT_AMBULATORY_CARE_PROVIDER_SITE_OTHER): Payer: Self-pay

## 2022-09-27 DIAGNOSIS — Z0000 Encounter for general adult medical examination without abnormal findings: Secondary | ICD-10-CM

## 2022-09-27 DIAGNOSIS — R7303 Prediabetes: Secondary | ICD-10-CM

## 2022-09-27 DIAGNOSIS — G4733 Obstructive sleep apnea (adult) (pediatric): Secondary | ICD-10-CM

## 2022-09-27 NOTE — Addendum Note (Signed)
Addended by: Marcelino Duster on: 09/27/2022 09:17 AM   Modules accepted: Orders

## 2022-09-27 NOTE — Progress Notes (Signed)
Have not been able to reappoint with her insurance to discuss getting her set up with CPAP titration in the sleep lab. She is willing to go ahead with home CPAP titration.

## 2022-09-28 LAB — CBC WITH DIFFERENTIAL/PLATELET
Basophils Absolute: 0.1 10*3/uL (ref 0.0–0.2)
Basos: 1 %
EOS (ABSOLUTE): 0.2 10*3/uL (ref 0.0–0.4)
Eos: 2 %
Hematocrit: 38.8 % (ref 34.0–46.6)
Hemoglobin: 13.2 g/dL (ref 11.1–15.9)
Immature Grans (Abs): 0 10*3/uL (ref 0.0–0.1)
Immature Granulocytes: 0 %
Lymphocytes Absolute: 3.4 10*3/uL — ABNORMAL HIGH (ref 0.7–3.1)
Lymphs: 35 %
MCH: 30.8 pg (ref 26.6–33.0)
MCHC: 34 g/dL (ref 31.5–35.7)
MCV: 91 fL (ref 79–97)
Monocytes Absolute: 0.7 10*3/uL (ref 0.1–0.9)
Monocytes: 7 %
Neutrophils Absolute: 5.4 10*3/uL (ref 1.4–7.0)
Neutrophils: 55 %
Platelets: 336 10*3/uL (ref 150–450)
RBC: 4.28 x10E6/uL (ref 3.77–5.28)
RDW: 12.5 % (ref 11.7–15.4)
WBC: 9.7 10*3/uL (ref 3.4–10.8)

## 2022-09-28 LAB — COMPREHENSIVE METABOLIC PANEL
ALT: 13 IU/L (ref 0–32)
AST: 18 IU/L (ref 0–40)
Albumin/Globulin Ratio: 1 — ABNORMAL LOW (ref 1.2–2.2)
Albumin: 3.9 g/dL (ref 3.8–4.9)
Alkaline Phosphatase: 99 IU/L (ref 44–121)
BUN/Creatinine Ratio: 16 (ref 9–23)
BUN: 12 mg/dL (ref 6–24)
Bilirubin Total: 0.4 mg/dL (ref 0.0–1.2)
CO2: 22 mmol/L (ref 20–29)
Calcium: 9.9 mg/dL (ref 8.7–10.2)
Chloride: 102 mmol/L (ref 96–106)
Creatinine, Ser: 0.76 mg/dL (ref 0.57–1.00)
Globulin, Total: 3.9 g/dL (ref 1.5–4.5)
Glucose: 137 mg/dL — ABNORMAL HIGH (ref 70–99)
Potassium: 4 mmol/L (ref 3.5–5.2)
Sodium: 140 mmol/L (ref 134–144)
Total Protein: 7.8 g/dL (ref 6.0–8.5)
eGFR: 92 mL/min/{1.73_m2} (ref >59)

## 2022-09-28 LAB — LIPID PANEL W/O CHOL/HDL RATIO
Cholesterol, Total: 203 mg/dL — ABNORMAL HIGH (ref 100–199)
HDL: 50 mg/dL (ref >39)
LDL Chol Calc (NIH): 136 mg/dL — ABNORMAL HIGH (ref 0–99)
Triglycerides: 95 mg/dL (ref 0–149)
VLDL Cholesterol Cal: 17 mg/dL (ref 5–40)

## 2022-09-28 LAB — HEMOGLOBIN A1C
Est. average glucose Bld gHb Est-mCnc: 143 mg/dL
Hgb A1c MFr Bld: 6.6 % — ABNORMAL HIGH (ref 4.8–5.6)

## 2022-09-28 LAB — VITAMIN D 25 HYDROXY (VIT D DEFICIENCY, FRACTURES): Vit D, 25-Hydroxy: 29.1 ng/mL — ABNORMAL LOW (ref 30.0–100.0)

## 2022-09-29 MED ORDER — ATORVASTATIN CALCIUM 20 MG PO TABS: 20.0000 mg | ORAL_TABLET | Freq: Every day | ORAL | 11 refills | Status: DC

## 2022-09-29 MED ORDER — METFORMIN HCL ER 500 MG PO TB24: ORAL_TABLET | ORAL | 11 refills | Status: DC

## 2022-09-29 NOTE — Addendum Note (Signed)
Addended by: Marcelino Duster on: 09/29/2022 03:57 PM   Modules accepted: Orders

## 2022-10-08 ENCOUNTER — Ambulatory Visit (HOSPITAL_COMMUNITY)
Admission: EM | Admit: 2022-10-08 | Discharge: 2022-10-08 | Disposition: A | Discharge status: Home / Self Care | Payer: Commercial Managed Care - HMO | Attending: Nurse Practitioner | Admitting: Nurse Practitioner

## 2022-10-08 ENCOUNTER — Ambulatory Visit (INDEPENDENT_AMBULATORY_CARE_PROVIDER_SITE_OTHER): Payer: Commercial Managed Care - HMO

## 2022-10-08 ENCOUNTER — Encounter (HOSPITAL_COMMUNITY): Payer: Self-pay | Admitting: Emergency Medicine

## 2022-10-08 DIAGNOSIS — M25562 Pain in left knee: Secondary | ICD-10-CM

## 2022-10-08 DIAGNOSIS — M1712 Unilateral primary osteoarthritis, left knee: Secondary | ICD-10-CM | POA: Diagnosis not present

## 2022-10-08 MED ORDER — KETOROLAC TROMETHAMINE 30 MG/ML IJ SOLN
INTRAMUSCULAR | Status: AC
  Filled 2022-10-08: qty 1

## 2022-10-08 MED ORDER — KETOROLAC TROMETHAMINE 30 MG/ML IJ SOLN
30.0000 mg | Freq: Once | INTRAMUSCULAR | Status: AC
  Administered 2022-10-08: 30 mg via INTRAMUSCULAR

## 2022-10-08 NOTE — Discharge Instructions (Addendum)
You were given a Toradol injection in clinic today. Do not take any over the counter NSAID's such as Advil, ibuprofen, Aleve, or naproxen for 24 hours.  You may take tylenol if needed Rest, elevate, ice to the knee as needed Follow-up with your PCP 2 to 3 days for recheck Please go to the ER for any worsening symptoms

## 2022-10-08 NOTE — ED Triage Notes (Signed)
Pt c/o left knee pain that started today. Worse when has leg straightened out.  Tried ibuprofen without relief.  Denies falls or injury.  Was out of work today and needs note.

## 2022-10-08 NOTE — ED Provider Notes (Signed)
[place]    CSN: [id number] Arrival date & time: 10/08/22  1547      History   Chief Complaint Chief Complaint  Patient presents with   Knee Pain    HPI Sarah Singh is a 56 y.o. female Libby Maw for evaluation of knee pain.  Patient reports this morning she woke up while walking to the bathroom she had onset of left knee pain that has been constant since that time.  Denies any known injury or inciting event.  Reports she cannot bear weight on it due to the pain.  States it feels swollen.  No bruising.  No numbness or tingling.  Denies history of knee injuries or surgeries in the past.  She states she took Tylenol for her pain this morning.  No other injuries or concerns at this time.   Knee Pain   Past Medical History:  Diagnosis Date   Arthritis    knees and hands   Essential hypertension 07/25/2018   Prediabetes 12/18/2014   Sleep apnea, obstructive 2023   Vitamin D deficiency 2016    Patient Active Problem List   Diagnosis Date Noted   Snoring 02/25/2022   Chronic dental pain 02/25/2022   Chronic pain of both knees 08/19/2021   Right foot pain 08/19/2021   Lipoma of left upper extremity 10/09/2019   Class 3 severe obesity with serious comorbidity and body mass index (BMI) of 40.0 to 44.9 in adult ([place]) 06/24/2019   Decreased visual acuity 10/29/2018   Guaiac + stool 10/29/2018   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 Authorizing Provider  atorvastatin (LIPITOR) 20 MG tablet Take 1 tablet (20 mg total) by mouth daily. 09/29/22   Mack Hook, MD  Calcium-Magnesium-Vitamin D 300-20-200 MG-MG-UNIT CHEW 2 chews by mouth twice daily. 08/19/21   Mack Hook, MD  hydrochlorothiazide (HYDRODIURIL) 25 MG tablet Take 1 tablet (25 mg total) by mouth daily. 07/16/22   Mack Hook, MD  lisinopril (ZESTRIL) 10 MG tablet Take 1 tablet by mouth once daily 08/11/22   Mack Hook, MD  metFORMIN (GLUCOPHAGE-XR) 500 MG 24 hr tablet 1 tab by mouth twice daily with a meal 09/29/22   Mack Hook, MD  Multiple Vitamins-Minerals (MULTIVITAMIN ADULTS 50+ PO) Take 1 tablet by mouth daily.    [provider]  tiZANidine (ZANAFLEX) 4 MG tablet Take 1 tablet (4 mg total) by mouth every 6 (six) hours as needed for muscle spasms. 08/01/22   Talbot Grumbling, FNP    Family History Family History  Problem Relation Age of Onset   Arthritis Mother  Hypertension Mother    Cancer Father    Diabetes Sister    Esophageal cancer Brother    Cancer Brother 58       throat cancer    Colon cancer Neg Hx    Colon polyps Neg Hx    Rectal cancer Neg Hx    Stomach cancer Neg Hx     Social History Social History   Tobacco Use   Smoking status: Former    Types: Cigarettes    Start date: 03/08/1980    Quit date: 03/08/2013    Years since quitting: 9.5    Passive exposure: Current (Boyfriend)   Smokeless tobacco: Never  Vaping Use   Vaping Use: Never used  Substance Use Topics   Alcohol use: Not Currently    Comment: History of abuse.  Stopped in 2010   Drug use: Not Currently    Types: Cocaine    Comment: no use since 2010.      Allergies   Other   Review of Systems Review of Systems  Musculoskeletal:        Left knee pain      Physical Exam Triage Vital Signs ED Triage Vitals  Enc Vitals Group     BP 10/08/22 1659 110/75     Pulse Rate 10/08/22 1659  70     Resp 10/08/22 1659 18     Temp 10/08/22 1659 97.9 F (36.6 C)     Temp Source 10/08/22 1659 Oral     SpO2 10/08/22 1659 98 %     Weight --      Height --      Head Circumference --      Peak Flow --      Pain Score 10/08/22 1658 10     Pain Loc --      Pain Edu? --      Excl. in [place]? --    No data found.  Updated Vital Signs BP 110/75 (BP Location: Right Arm)   Pulse 70   Temp 97.9 F (36.6 C) (Oral)   Resp 18   LMP  (LMP Unknown)   SpO2 98%   Visual Acuity Right Eye Distance:   Left Eye Distance:   Bilateral Distance:    Right Eye Near:   Left Eye Near:    Bilateral Near:     Physical Exam Vitals and nursing note reviewed.  Constitutional:      Appearance: Normal appearance.  HENT:     Head: Normocephalic and atraumatic.  Eyes:     Pupils: Pupils are equal, round, and reactive to light.  Cardiovascular:     Rate and Rhythm: Normal rate.  Pulmonary:     Effort: Pulmonary effort is normal.  Musculoskeletal:     Left knee: No swelling, deformity, effusion, erythema, ecchymosis, lacerations or crepitus. Tenderness present over the medial joint line, lateral joint line and patellar tendon. Normal patellar mobility.     Comments: Tenderness to palpation to the entire left knee anteriorly and posteriorly.  Full range of motion with pain on active extension.  Skin:    General: Skin is warm and dry.  Neurological:     General: No focal deficit present.     Mental Status: She is alert and oriented to person, place, and time.  Psychiatric:        Mood and Affect: Mood normal.        Behavior: Behavior normal.      UC Treatments / Results  Labs (  all labs ordered are listed, but only abnormal results are displayed) Labs Reviewed - No data to display  EKG   Radiology DG Knee Complete 4 Views Left  Result Date: 10/08/2022 CLINICAL DATA:  Knee pain EXAM: LEFT KNEE - COMPLETE 4+ VIEW COMPARISON:  None Available. FINDINGS: No fracture or malalignment.  Trace knee effusion. Mild tricompartment arthritis of the knee. Mild infrapatellar soft tissue edema. IMPRESSION: Mild tricompartment arthritis with trace effusion. Mild infrapatellar soft tissue edema. Electronically Signed   By: Donavan Foil M.D.   On: 10/08/2022 17:37    Procedures Procedures (including critical care time)  Medications Ordered in UC Medications  ketorolac (TORADOL) 30 MG/ML injection 30 mg (30 mg Intramuscular Given 10/08/22 1737)    Initial Impression / Assessment and Plan / UC Course  I have reviewed the triage vital signs and the nursing notes.  Pertinent labs & imaging results that were available during my care of the patient were reviewed by me and considered in my medical decision making (see chart for details).     Reviewed exam and x-ray with patient.  No fracture.  Discussed arthritis as cause of symptoms Patient given Toradol injection in clinic.  Monitored for 10 minutes after injection with no reaction noted and tolerated well.  She was instructed no NSAIDs for 24 hours and she verbalized understanding May take Tylenol as needed RICE therapy PCP follow-up 2 to 3 days for recheck ER precautions reviewed and patient verbalized understanding Final Clinical Impressions(s) / UC Diagnoses   Final diagnoses:  Acute pain of left knee  Arthritis of left knee     Discharge Instructions      You were given a Toradol injection in clinic today. Do not take any over the counter NSAID's such as Advil, ibuprofen, Aleve, or naproxen for 24 hours.  You may take tylenol if needed Rest, elevate, ice to the knee as needed Follow-up with your PCP 2 to 3 days for recheck Please go to the ER for any worsening symptoms      ED Prescriptions   None    PDMP not reviewed this encounter.   Melynda Ripple, NP 10/08/22 1750

## 2022-10-21 ENCOUNTER — Ambulatory Visit
Admission: RE | Admit: 2022-10-21 | Discharge: 2022-10-21 | Disposition: A | Discharge status: Home / Self Care | Payer: Commercial Managed Care - HMO | Source: Ambulatory Visit | Attending: Internal Medicine | Admitting: Internal Medicine

## 2022-10-21 DIAGNOSIS — Z1231 Encounter for screening mammogram for malignant neoplasm of breast: Secondary | ICD-10-CM

## 2022-11-04 ENCOUNTER — Telehealth: Payer: Self-pay

## 2022-11-04 NOTE — Telephone Encounter (Signed)
Patient called to report that her new insurance has started. Would like to see if it will cover her sleep studies.

## 2022-11-22 ENCOUNTER — Other Ambulatory Visit: Payer: Self-pay | Admitting: Internal Medicine

## 2022-11-28 ENCOUNTER — Encounter: Payer: Self-pay | Admitting: Internal Medicine

## 2022-12-24 ENCOUNTER — Other Ambulatory Visit: Payer: Self-pay

## 2022-12-24 DIAGNOSIS — E119 Type 2 diabetes mellitus without complications: Secondary | ICD-10-CM

## 2022-12-24 DIAGNOSIS — R7989 Other specified abnormal findings of blood chemistry: Secondary | ICD-10-CM

## 2022-12-24 DIAGNOSIS — Z13220 Encounter for screening for lipoid disorders: Secondary | ICD-10-CM

## 2022-12-25 LAB — VITAMIN D 25 HYDROXY (VIT D DEFICIENCY, FRACTURES): Vit D, 25-Hydroxy: 17.2 ng/mL — ABNORMAL LOW (ref 30.0–100.0)

## 2022-12-25 LAB — COMPREHENSIVE METABOLIC PANEL
ALT: 13 IU/L (ref 0–32)
AST: 21 IU/L (ref 0–40)
Albumin/Globulin Ratio: 1 — ABNORMAL LOW (ref 1.2–2.2)
Albumin: 4 g/dL (ref 3.8–4.9)
Alkaline Phosphatase: 116 IU/L (ref 44–121)
BUN/Creatinine Ratio: 19 (ref 9–23)
BUN: 14 mg/dL (ref 6–24)
Bilirubin Total: 0.6 mg/dL (ref 0.0–1.2)
CO2: 19 mmol/L — ABNORMAL LOW (ref 20–29)
Calcium: 9 mg/dL (ref 8.7–10.2)
Chloride: 106 mmol/L (ref 96–106)
Creatinine, Ser: 0.74 mg/dL (ref 0.57–1.00)
Globulin, Total: 4 g/dL (ref 1.5–4.5)
Glucose: 101 mg/dL — ABNORMAL HIGH (ref 70–99)
Potassium: 4.9 mmol/L (ref 3.5–5.2)
Sodium: 144 mmol/L (ref 134–144)
Total Protein: 8 g/dL (ref 6.0–8.5)
eGFR: 95 mL/min/{1.73_m2} (ref >59)

## 2022-12-25 LAB — HEMOGLOBIN A1C
Est. average glucose Bld gHb Est-mCnc: 134 mg/dL
Hgb A1c MFr Bld: 6.3 % — ABNORMAL HIGH (ref 4.8–5.6)

## 2022-12-25 LAB — LIPID PANEL W/O CHOL/HDL RATIO
Cholesterol, Total: 148 mg/dL (ref 100–199)
HDL: 49 mg/dL (ref >39)
LDL Chol Calc (NIH): 82 mg/dL (ref 0–99)
Triglycerides: 88 mg/dL (ref 0–149)
VLDL Cholesterol Cal: 17 mg/dL (ref 5–40)

## 2022-12-30 ENCOUNTER — Ambulatory Visit: Payer: Self-pay | Admitting: Internal Medicine

## 2022-12-30 ENCOUNTER — Encounter: Payer: Self-pay | Admitting: Internal Medicine

## 2022-12-30 VITALS — BP 120/72 | HR 80 | Resp 20 | Ht 63.75 in | Wt 248.5 lb

## 2022-12-30 DIAGNOSIS — E119 Type 2 diabetes mellitus without complications: Secondary | ICD-10-CM

## 2022-12-30 DIAGNOSIS — E559 Vitamin D deficiency, unspecified: Secondary | ICD-10-CM

## 2022-12-30 DIAGNOSIS — I10 Essential (primary) hypertension: Secondary | ICD-10-CM

## 2022-12-30 DIAGNOSIS — E7800 Pure hypercholesterolemia, unspecified: Secondary | ICD-10-CM | POA: Insufficient documentation

## 2022-12-30 MED ORDER — VITAMIN D3 25 MCG (1000 UT) PO CAPS: 1000.0000 [IU] | ORAL_CAPSULE | Freq: Every day | ORAL | 0 refills | Status: DC

## 2022-12-30 MED ORDER — ATORVASTATIN CALCIUM 40 MG PO TABS: 40.0000 mg | ORAL_TABLET | Freq: Every day | ORAL | 3 refills | Status: DC

## 2022-12-30 NOTE — Progress Notes (Signed)
    Subjective:    Patient ID: Sarah Singh, female   DOB: [birth year], 57 y.o.   MRN: [id number]   HPI   Hypercholesterolemia:  LDL down to 82.  Taking Atorvastatin 20 mg daily.  2.  DM:  Diagnosed with DM with A1C at 6.6 % following last CPE in October 2023.  A1C in better range at 6.3% on Metformin.  Walking more to work.    3.  Hypertension:  Has to urinate at work and they are giving her problems.  She is taking HCTZ which increases her need to do so. Would like a letter.  4.  Low Vitamin D level following CPE in October and continues on downward trend:  went from 29.1 to 17.2 when rechecked prior to this visit. Not clear what strength of calcium/vitamin D she is taking.  Current Meds  Medication Sig   atorvastatin (LIPITOR) 20 MG tablet Take 1 tablet (20 mg total) by mouth daily.   Calcium-Magnesium-Vitamin D 300-20-200 MG-MG-UNIT CHEW 2 chews by mouth twice daily.   hydrochlorothiazide (HYDRODIURIL) 25 MG tablet Take 1 tablet by mouth once daily   lisinopril (ZESTRIL) 10 MG tablet Take 1 tablet by mouth once daily   metFORMIN (GLUCOPHAGE-XR) 500 MG 24 hr tablet 1 tab by mouth twice daily with a meal   Multiple Vitamins-Minerals (MULTIVITAMIN ADULTS 50+ PO) Take 1 tablet by mouth daily.   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  Radial and DP pulses normal and equal.   LE:  no edema.   Assessment & Plan    Hypercholesterolemia:  Increase Atorvastatin to 40 mg daily.  Follow up in 2 months for fasting las  2.  DM:  controlled.  To continue with goals for physical activity and diet for weight loss.  Encouraged [facility] and water exercise with history of knee pain.    3.  Hypertension:  controlled.  Letter for work with need to urinate.  4.  Vitamin D deficiency:  To take D3 1000 units daily.  Repeat level in 3 months.

## 2022-12-30 NOTE — Patient Instructions (Addendum)
[facility] [place], [place] [address]  [phone] Hours of Operation Mondays to Thursdays: 8 am to 8 pm Fridays: 9 am to 8 pm Saturdays: 9 am to 1 pm Sundays: Closed  Call your insurance info to [place] next week and we will see if covers sleep study better in house

## 2023-01-07 ENCOUNTER — Telehealth: Payer: Self-pay | Admitting: Internal Medicine

## 2023-01-14 ENCOUNTER — Encounter (HOSPITAL_COMMUNITY): Payer: Self-pay | Admitting: Emergency Medicine

## 2023-01-14 ENCOUNTER — Ambulatory Visit (HOSPITAL_COMMUNITY)
Admission: EM | Admit: 2023-01-14 | Discharge: 2023-01-14 | Disposition: A | Discharge status: Home / Self Care | Payer: Medicaid Other | Attending: Nurse Practitioner | Admitting: Nurse Practitioner

## 2023-01-14 DIAGNOSIS — M25562 Pain in left knee: Secondary | ICD-10-CM | POA: Diagnosis not present

## 2023-01-14 MED ORDER — DICLOFENAC SODIUM 1 % EX GEL: 4.0000 g | Freq: Four times a day (QID) | CUTANEOUS | 0 refills | Status: DC

## 2023-01-14 MED ORDER — KETOROLAC TROMETHAMINE 30 MG/ML IJ SOLN
INTRAMUSCULAR | Status: AC
  Filled 2023-01-14: qty 1

## 2023-01-14 MED ORDER — KETOROLAC TROMETHAMINE 30 MG/ML IJ SOLN
30.0000 mg | Freq: Once | INTRAMUSCULAR | Status: AC
  Administered 2023-01-14: 30 mg via INTRAMUSCULAR
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
## 2023-01-14 NOTE — Discharge Instructions (Addendum)
As we discussed, the pain in your knee is most likely coming from arthritis We have given you a shot of Toradol today to help with pain.  Do not take any other NSAIDs (Advil, naproxen, ibuprofen, Aleve) for the next 48 hours In the meantime, you can take Tylenol 500 mg -1000 mg every 6 hours as needed for pain You can also use the Voltaren topically every 6 hours as needed for pain Follow up with an Orthopedic Specialist for ongoing knee pain

## 2023-01-14 NOTE — ED Triage Notes (Signed)
Pt c/o left knee pain and swelling since Wed. Denies falls or injury. Reports hx arthritis.

## 2023-01-17 NOTE — Telephone Encounter (Signed)
I contacted patient Sarah Singh 01/17/23 to ask her what are her hours of work and the name of the medication that was making her hungry. She stated that she goes in to work at 9 am and her first break is at 1 pm . She also stated that she is unsure of what medication is causing her to be hungry. She wants a note to be able to go and have a fruit in between 9 am - 1 pm when she is hungry.

## 2023-01-30 ENCOUNTER — Other Ambulatory Visit: Payer: Self-pay | Admitting: Internal Medicine

## 2023-02-15 ENCOUNTER — Ambulatory Visit
Admission: RE | Admit: 2023-02-15 | Discharge: 2023-02-15 | Disposition: A | Discharge status: Home / Self Care | Payer: Commercial Managed Care - HMO | Source: Ambulatory Visit | Attending: Internal Medicine | Admitting: Internal Medicine

## 2023-02-17 ENCOUNTER — Other Ambulatory Visit: Payer: Commercial Managed Care - HMO

## 2023-02-17 DIAGNOSIS — E7800 Pure hypercholesterolemia, unspecified: Secondary | ICD-10-CM | POA: Diagnosis not present

## 2023-02-19 LAB — HEPATIC FUNCTION PANEL
ALT: 11 IU/L (ref 0–32)
AST: 16 IU/L (ref 0–40)
Albumin: 3.8 g/dL (ref 3.8–4.9)
Alkaline Phosphatase: 122 IU/L — ABNORMAL HIGH (ref 44–121)
Bilirubin Total: 0.4 mg/dL (ref 0.0–1.2)
Bilirubin, Direct: 0.11 mg/dL (ref 0.00–0.40)
Total Protein: 7.2 g/dL (ref 6.0–8.5)

## 2023-02-19 LAB — LIPID PANEL W/O CHOL/HDL RATIO
Cholesterol, Total: 145 mg/dL (ref 100–199)
HDL: 43 mg/dL (ref >39)
LDL Chol Calc (NIH): 87 mg/dL (ref 0–99)
Triglycerides: 77 mg/dL (ref 0–149)
VLDL Cholesterol Cal: 15 mg/dL (ref 5–40)

## 2023-02-24 ENCOUNTER — Encounter: Payer: Self-pay | Admitting: Internal Medicine

## 2023-02-24 ENCOUNTER — Ambulatory Visit (INDEPENDENT_AMBULATORY_CARE_PROVIDER_SITE_OTHER): Payer: Commercial Managed Care - HMO | Admitting: Internal Medicine

## 2023-02-24 VITALS — BP 124/80 | HR 76 | Resp 16 | Ht 63.75 in | Wt 244.0 lb

## 2023-02-24 DIAGNOSIS — H538 Other visual disturbances: Secondary | ICD-10-CM

## 2023-02-24 DIAGNOSIS — G4733 Obstructive sleep apnea (adult) (pediatric): Secondary | ICD-10-CM

## 2023-02-24 DIAGNOSIS — I10 Essential (primary) hypertension: Secondary | ICD-10-CM

## 2023-02-24 DIAGNOSIS — Z23 Encounter for immunization: Secondary | ICD-10-CM | POA: Diagnosis not present

## 2023-02-24 DIAGNOSIS — E119 Type 2 diabetes mellitus without complications: Secondary | ICD-10-CM | POA: Diagnosis not present

## 2023-02-24 DIAGNOSIS — E7800 Pure hypercholesterolemia, unspecified: Secondary | ICD-10-CM

## 2023-02-24 MED ORDER — ROSUVASTATIN CALCIUM 20 MG PO TABS
ORAL_TABLET | ORAL | 11 refills | Status: DC
Start: 2023-02-24 — End: 2023-02-24

## 2023-02-24 MED ORDER — ROSUVASTATIN CALCIUM 20 MG PO TABS
20.0000 mg | ORAL_TABLET | Freq: Every day | ORAL | 3 refills | Status: DC
Start: 2023-02-24 — End: 2023-12-19

## 2023-02-24 NOTE — Progress Notes (Signed)
    Subjective:    Patient ID: Sarah Singh, female   DOB: 1966/05/26, 57 y.o.   MRN: [id number]   HPI   OSA:  She now has Medicaid and is ready to pursue in house CPAP titration.  She underwent sleep study in June of 2023 with findings of severe O2 desaturation and moderate OSA.    2.  Hyperlipidemia:  She did not bring in meds today.  She cannot say what strength of Atorvastatin she is taking.  Was to increase to 40 mg after visit in February.  Her lipid profile is essentially the same with LDL in the 80s.  [facility]  Using a recumbent bike.  No interest in the pool.  Has tried to work on diet. Lipid Panel     Component Value Date/Time   CHOL 145 02/17/2023 0823   TRIG 77 02/17/2023 0823   HDL 43 02/17/2023 0823   LDLCALC 87 02/17/2023 0823   LABVLDL 15 02/17/2023 0823   3.  DM:  Does not check blood glucose.  A1C was at 6.3% last check.  4.  Hypertension:  controlled  5.  HM:  recent DEXA was normal.  6.  Bilateral decreased vision--blurry even with reading glasses she wears.     Current Meds  Medication Sig   atorvastatin (LIPITOR) 40 MG tablet Take 1 tablet (40 mg total) by mouth daily.   Calcium-Magnesium-Vitamin D 300-20-200 MG-MG-UNIT CHEW 2 chews by mouth twice daily.   Cholecalciferol (VITAMIN D3) 25 MCG (1000 UT) capsule Take 1 capsule (1,000 Units total) by mouth daily.   diclofenac Sodium (VOLTAREN ARTHRITIS PAIN) 1 % GEL Apply 4 g topically 4 (four) times daily.   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  Radial and DP pulses normal and equal. LE:  no edema   Assessment & Plan   OSA:  hopefully will finally be able to get in for CPAP titration.  2.  Hypercholesterolemia:  No change with doubling of Atorvastatin.  She describes eating in a healthier manner and is more active.  She agrees to increase her days of physical activity gradually to 4-5 days weekly.  Switch to Rosuvastatin 20 mg daily and repeat FLP/hepatic profile in 2 months.  3.  DM:  has been well controlled.  A1C with labs in 2 months.  Urine microalbumin/crea today.    4.  Decreased visual acuity:  referral to Dr. Dione Booze.  Also for diabetic eye care.     5.  Hypertension:  well controlled.  6.  HM:  DEXA normal.  Pneumococcal conjugate 20 vaccine today.

## 2023-02-24 NOTE — Patient Instructions (Signed)
Encourage you to get the [facility] or [employer] [id number] COvID vaccine.

## 2023-02-25 ENCOUNTER — Ambulatory Visit (HOSPITAL_COMMUNITY)
Admission: EM | Admit: 2023-02-25 | Discharge: 2023-02-25 | Disposition: A | Discharge status: Home / Self Care | Payer: Medicaid Other | Attending: Emergency Medicine | Admitting: Emergency Medicine

## 2023-02-25 ENCOUNTER — Encounter (HOSPITAL_COMMUNITY): Payer: Self-pay | Admitting: *Deleted

## 2023-02-25 DIAGNOSIS — S39012A Strain of muscle, fascia and tendon of lower back, initial encounter: Secondary | ICD-10-CM | POA: Diagnosis not present

## 2023-02-25 DIAGNOSIS — S161XXA Strain of muscle, fascia and tendon at neck level, initial encounter: Secondary | ICD-10-CM

## 2023-02-25 LAB — MICROALBUMIN / CREATININE URINE RATIO
Creatinine, Urine: 59.9 mg/dL
Microalb/Creat Ratio: <5 mg/g creat (ref 0–29)
Microalbumin, Urine: <3 ug/mL

## 2023-02-25 MED ORDER — METHOCARBAMOL 500 MG PO TABS: 500.0000 mg | ORAL_TABLET | Freq: Two times a day (BID) | ORAL | 0 refills | Status: DC

## 2023-02-25 MED ORDER — KETOROLAC TROMETHAMINE 60 MG/2ML IM SOLN: 60.0000 mg | Freq: Once | INTRAMUSCULAR | Status: DC

## 2023-02-25 MED ORDER — NAPROXEN 500 MG PO TABS: 500.0000 mg | ORAL_TABLET | Freq: Two times a day (BID) | ORAL | 0 refills | Status: DC
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
## 2023-02-27 ENCOUNTER — Other Ambulatory Visit: Payer: Self-pay

## 2023-02-27 ENCOUNTER — Emergency Department (HOSPITAL_COMMUNITY)
Admission: EM | Admit: 2023-02-27 | Discharge: 2023-02-27 | Disposition: A | Discharge status: Home / Self Care | Payer: Commercial Managed Care - HMO | Attending: Emergency Medicine | Admitting: Emergency Medicine

## 2023-02-27 ENCOUNTER — Emergency Department (HOSPITAL_COMMUNITY): Payer: Commercial Managed Care - HMO

## 2023-02-27 ENCOUNTER — Encounter (HOSPITAL_COMMUNITY): Payer: Self-pay

## 2023-02-27 DIAGNOSIS — M25562 Pain in left knee: Secondary | ICD-10-CM | POA: Insufficient documentation

## 2023-02-27 DIAGNOSIS — M62830 Muscle spasm of back: Secondary | ICD-10-CM | POA: Insufficient documentation

## 2023-02-27 DIAGNOSIS — Y92410 Unspecified street and highway as the place of occurrence of the external cause: Secondary | ICD-10-CM | POA: Insufficient documentation

## 2023-02-27 DIAGNOSIS — E119 Type 2 diabetes mellitus without complications: Secondary | ICD-10-CM | POA: Diagnosis not present

## 2023-02-27 DIAGNOSIS — E559 Vitamin D deficiency, unspecified: Secondary | ICD-10-CM | POA: Insufficient documentation

## 2023-02-27 DIAGNOSIS — I10 Essential (primary) hypertension: Secondary | ICD-10-CM | POA: Insufficient documentation

## 2023-02-27 DIAGNOSIS — M549 Dorsalgia, unspecified: Secondary | ICD-10-CM

## 2023-02-27 MED ORDER — LIDOCAINE 5 % EX PTCH: 1.0000 | MEDICATED_PATCH | CUTANEOUS | 0 refills | Status: DC

## 2023-02-27 MED ORDER — CYCLOBENZAPRINE HCL 10 MG PO TABS: 10.0000 mg | ORAL_TABLET | Freq: Two times a day (BID) | ORAL | 0 refills | Status: DC | PRN
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
## 2023-03-05 ENCOUNTER — Encounter (HOSPITAL_COMMUNITY): Payer: Self-pay

## 2023-03-05 ENCOUNTER — Ambulatory Visit (HOSPITAL_COMMUNITY)
Admission: EM | Admit: 2023-03-05 | Discharge: 2023-03-05 | Disposition: A | Discharge status: Home / Self Care | Payer: Medicaid Other | Attending: Physician Assistant | Admitting: Physician Assistant

## 2023-03-05 DIAGNOSIS — M545 Low back pain, unspecified: Secondary | ICD-10-CM

## 2023-03-05 MED ORDER — KETOROLAC TROMETHAMINE 30 MG/ML IJ SOLN
30.0000 mg | Freq: Once | INTRAMUSCULAR | Status: AC
  Administered 2023-03-05: 30 mg via INTRAMUSCULAR

## 2023-03-05 MED ORDER — KETOROLAC TROMETHAMINE 30 MG/ML IJ SOLN
INTRAMUSCULAR | Status: AC
  Filled 2023-03-05: qty 1

## 2023-03-05 NOTE — Discharge Instructions (Signed)
Thank you for coming in today.  You were given a shot of Toradol.  This should help your pain and inflammation.  Please avoid any NSAIDs such as naproxen, Aleve, ibuprofen for the rest of the day.  You may continue tomorrow anti-inflammatories as needed.  You can take your muscle relaxers as needed as you were previously prescribed.  You may use ice or heat to the area.  Follow-up with your primary care if not improving.
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
## 2023-03-10 ENCOUNTER — Encounter (HOSPITAL_COMMUNITY): Payer: Self-pay | Admitting: *Deleted

## 2023-03-10 ENCOUNTER — Other Ambulatory Visit: Payer: Self-pay

## 2023-03-10 ENCOUNTER — Ambulatory Visit (HOSPITAL_COMMUNITY)
Admission: EM | Admit: 2023-03-10 | Discharge: 2023-03-10 | Disposition: A | Discharge status: Home / Self Care | Payer: Medicaid Other | Attending: Emergency Medicine | Admitting: Emergency Medicine

## 2023-03-10 DIAGNOSIS — M549 Dorsalgia, unspecified: Secondary | ICD-10-CM

## 2023-03-10 MED ORDER — IBUPROFEN 800 MG PO TABS: 800.0000 mg | ORAL_TABLET | Freq: Three times a day (TID) | ORAL | 0 refills | Status: DC

## 2023-03-10 MED ORDER — LIDOCAINE 5 % EX PTCH: 1.0000 | MEDICATED_PATCH | CUTANEOUS | 0 refills | Status: DC

## 2023-03-10 MED ORDER — TIZANIDINE HCL 4 MG PO CAPS: 4.0000 mg | ORAL_CAPSULE | Freq: Three times a day (TID) | ORAL | 0 refills | Status: DC | PRN
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
## 2023-03-10 NOTE — Discharge Instructions (Addendum)
You can take the muscle relaxer (Zanaflex) three times daily. If the medication makes you drowsy, take only one at bed time.  Ibuprofen can be taken every 6 hours (3x daily) Take with food  Lidocaine patch can be applied for 12 hours at a time  Combination of these medications should help to relieve and improve pain.  I recommend you follow-up with the orthopedic specialists if still having pain. [facility] has walk-in hours during the week.  Happy belated birthday!!
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
## 2023-03-30 ENCOUNTER — Encounter (HOSPITAL_BASED_OUTPATIENT_CLINIC_OR_DEPARTMENT_OTHER): Payer: Self-pay

## 2023-04-26 ENCOUNTER — Other Ambulatory Visit: Payer: Medicaid Other

## 2023-04-26 DIAGNOSIS — E559 Vitamin D deficiency, unspecified: Secondary | ICD-10-CM

## 2023-04-26 DIAGNOSIS — E119 Type 2 diabetes mellitus without complications: Secondary | ICD-10-CM | POA: Diagnosis not present

## 2023-04-26 DIAGNOSIS — E7800 Pure hypercholesterolemia, unspecified: Secondary | ICD-10-CM

## 2023-04-27 LAB — VITAMIN D 25 HYDROXY (VIT D DEFICIENCY, FRACTURES): Vit D, 25-Hydroxy: 33.7 ng/mL (ref 30.0–100.0)

## 2023-04-27 LAB — LIPID PANEL W/O CHOL/HDL RATIO
Cholesterol, Total: 135 mg/dL (ref 100–199)
HDL: 51 mg/dL (ref >39)
LDL Chol Calc (NIH): 68 mg/dL (ref 0–99)
Triglycerides: 82 mg/dL (ref 0–149)
VLDL Cholesterol Cal: 16 mg/dL (ref 5–40)

## 2023-04-27 LAB — HEPATIC FUNCTION PANEL
ALT: 12 IU/L (ref 0–32)
AST: 14 IU/L (ref 0–40)
Albumin: 4.2 g/dL (ref 3.8–4.9)
Alkaline Phosphatase: 129 IU/L — ABNORMAL HIGH (ref 44–121)
Bilirubin Total: 0.5 mg/dL (ref 0.0–1.2)
Bilirubin, Direct: 0.17 mg/dL (ref 0.00–0.40)
Total Protein: 8.1 g/dL (ref 6.0–8.5)

## 2023-04-27 LAB — HEMOGLOBIN A1C
Est. average glucose Bld gHb Est-mCnc: 143 mg/dL
Hgb A1c MFr Bld: 6.6 % — ABNORMAL HIGH (ref 4.8–5.6)

## 2023-05-08 ENCOUNTER — Ambulatory Visit (HOSPITAL_BASED_OUTPATIENT_CLINIC_OR_DEPARTMENT_OTHER): Payer: Medicaid Other | Attending: Internal Medicine | Admitting: Internal Medicine

## 2023-05-08 VITALS — Ht 64.0 in | Wt 242.0 lb

## 2023-05-08 DIAGNOSIS — G4761 Periodic limb movement disorder: Secondary | ICD-10-CM | POA: Diagnosis not present

## 2023-05-08 DIAGNOSIS — G4733 Obstructive sleep apnea (adult) (pediatric): Secondary | ICD-10-CM | POA: Diagnosis present

## 2023-05-14 DIAGNOSIS — G4733 Obstructive sleep apnea (adult) (pediatric): Secondary | ICD-10-CM | POA: Diagnosis not present

## 2023-05-14 NOTE — Procedures (Signed)
     Patient Name: Sarah Singh, Sarah Singh Date: 05/08/2023 Gender: Female D.O.B: 1965-12-27 Age (years): 57 Referring Provider: Julieanne Manson Height (inches): 64 Interpreting Physician: Jetty Duhamel MD, ABSM Weight (lbs): 242 RPSGT: [place], [place] BMI: 42 MRN: [id number] Neck Size: 14.00  CLINICAL INFORMATION The patient is referred for a CPAP titration to treat sleep apnea.  Date of NPSG, Split Night or HST:  04/11/22 NPSG AHI 21.8/ hr, desaturation to 67%, body weight 232 lbs  SLEEP STUDY TECHNIQUE As per the AASM Manual for the Scoring of Sleep and Associated Events v2.3 (April 2016) with a hypopnea requiring 4% desaturations.  The channels recorded and monitored were frontal, central and occipital EEG, electrooculogram (EOG), submentalis EMG (chin), nasal and oral airflow, thoracic and abdominal wall motion, anterior tibialis EMG, snore microphone, electrocardiogram, and pulse oximetry. Continuous positive airway pressure (CPAP) was initiated at the beginning of the study and titrated to treat sleep-disordered breathing.  MEDICATIONS Medications self-administered by patient taken the night of the study : none reported  TECHNICIAN COMMENTS Comments added by technician: The patient tolerated CPAP and mask well. Significant limb movements fragmented sleep during the night. Comments added by scorer: N/A  RESPIRATORY PARAMETERS Optimal PAP Pressure (cm): 11 AHI at Optimal Pressure (/hr): 2.6 Overall Minimal O2 (%): 89.0 Supine % at Optimal Pressure (%): 71 Minimal O2 at Optimal Pressure (%): 91.0   SLEEP ARCHITECTURE The study was initiated at 10:23:53 PM and ended at 4:59:35 AM.  Sleep onset time was 4.4 minutes and the sleep efficiency was 88.5%. The total sleep time was 350.3 minutes.  The patient spent 23.7% of the night in stage N1 sleep, 44.5% in stage N2 sleep, 10.4% in stage N3 and 21.4% in REM.Stage REM latency was 16.0 minutes  Wake after sleep onset was 41.0.  Alpha intrusion was absent. Supine sleep was 63.32%.  CARDIAC DATA The 2 lead EKG demonstrated sinus rhythm. The mean heart rate was 59.1 beats per minute. Other EKG findings include: None.  LEG MOVEMENT DATA The total Periodic Limb Movements of Sleep (PLMS) were 0. The PLMS index was 0.0. A PLMS index of <15 is considered normal in adults.  IMPRESSIONS - The optimal PAP pressure was 11 cm of water. - Mild oxygen desaturations were observed during this titration (min O2 = 89.0%).On CPAP 11, minimum O2 saturation was 91%. - The patient snored with soft snoring volume during this titration study. - No cardiac abnormalities were observed during this study. - Limb movement sleep disturbance total LM 399 (68.3/hr), LM with arousal/ awakening total 112 (19.2/ hr).  DIAGNOSIS - Obstructive Sleep Apnea (G47.33) - Periodic Limb Movement  RECOMMENDATIONS - Trial of CPAP therapy on 11 cm H2O or autopap 5-15. - Patient wore a Medium size Philips Respironics Nasal Pico mask and heated humidification. - Consider trial of Requip or Mirapexd for limb movement sleep disturbance. - Sleep hygiene should be reviewed to assess factors that may improve sleep quality. - Weight management and regular exercise should be initiated or continued.  [Electronically signed] 05/14/2023 04:09 PM  Jetty Duhamel MD, ABSM Diplomate, American Board of Sleep Medicine NPI: [id number]                        Jetty Duhamel Diplomate, American Board of Sleep Medicine  ELECTRONICALLY SIGNED ON:  05/14/2023, 4:04 PM [facility] PH: [phone]   FX: [phone] ACCREDITED BY THE AMERICAN ACADEMY OF SLEEP MEDICINE

## 2023-05-16 ENCOUNTER — Telehealth: Payer: Self-pay | Admitting: Internal Medicine

## 2023-05-16 DIAGNOSIS — G4733 Obstructive sleep apnea (adult) (pediatric): Secondary | ICD-10-CM

## 2023-05-16 NOTE — Telephone Encounter (Signed)
Sleep study for CPAP titration completed on 05/08/2023. Orders written.

## 2023-05-20 NOTE — Telephone Encounter (Signed)
Confirmed with [facility] that they received that order.

## 2023-05-24 ENCOUNTER — Encounter (HOSPITAL_COMMUNITY): Payer: Self-pay | Admitting: *Deleted

## 2023-05-24 ENCOUNTER — Ambulatory Visit (HOSPITAL_COMMUNITY)
Admission: EM | Admit: 2023-05-24 | Discharge: 2023-05-24 | Disposition: A | Discharge status: Home / Self Care | Payer: Medicaid Other | Attending: Family Medicine | Admitting: Family Medicine

## 2023-05-24 DIAGNOSIS — M79671 Pain in right foot: Secondary | ICD-10-CM | POA: Diagnosis not present

## 2023-05-24 MED ORDER — KETOROLAC TROMETHAMINE 30 MG/ML IJ SOLN
30.0000 mg | Freq: Once | INTRAMUSCULAR | Status: AC
  Administered 2023-05-24: 30 mg via INTRAMUSCULAR

## 2023-05-24 MED ORDER — IBUPROFEN 800 MG PO TABS: 800.0000 mg | ORAL_TABLET | Freq: Three times a day (TID) | ORAL | 0 refills | Status: DC | PRN

## 2023-05-24 MED ORDER — KETOROLAC TROMETHAMINE 30 MG/ML IJ SOLN
INTRAMUSCULAR | Status: AC
  Filled 2023-05-24: qty 1

## 2023-05-24 NOTE — ED Triage Notes (Signed)
Pt states she went dancing Saturday and she is having right foot pain since. She has been taking Ibu but would like a shot for pain.

## 2023-05-24 NOTE — Discharge Instructions (Signed)
You have been given a shot of Toradol 30 mg today.   Take ibuprofen 800 mg--1 tab every 8 hours as needed for pain.    
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
## 2023-06-06 ENCOUNTER — Telehealth: Payer: Self-pay | Admitting: Internal Medicine

## 2023-06-06 NOTE — Telephone Encounter (Signed)
Patient called in today stating that she received her sleep machine and that is making her mouth dry and also gag. Patient said she only used it once because of those symptoms.

## 2023-06-06 NOTE — Telephone Encounter (Signed)
Called patient to ask is she was using distilled water for humidification and patient confirmed. Per Dr. Delrae Alfred , patient needs to reach out to the company for the machine. Information to be determined and given to patient

## 2023-06-09 LAB — HM DIABETES EYE EXAM

## 2023-07-11 ENCOUNTER — Other Ambulatory Visit: Payer: Self-pay

## 2023-07-11 ENCOUNTER — Emergency Department (HOSPITAL_COMMUNITY)
Admission: EM | Admit: 2023-07-11 | Discharge: 2023-07-11 | Discharge status: Against Medical Advice | Payer: Medicaid Other | Attending: Emergency Medicine | Admitting: Emergency Medicine

## 2023-07-11 ENCOUNTER — Encounter (HOSPITAL_COMMUNITY): Payer: Self-pay

## 2023-07-11 ENCOUNTER — Emergency Department (HOSPITAL_COMMUNITY)
Admission: EM | Admit: 2023-07-11 | Discharge: 2023-07-12 | Discharge status: Against Medical Advice | Payer: Medicaid Other | Attending: Emergency Medicine | Admitting: Emergency Medicine

## 2023-07-11 ENCOUNTER — Encounter (HOSPITAL_COMMUNITY): Payer: Self-pay | Admitting: Emergency Medicine

## 2023-07-11 ENCOUNTER — Emergency Department (HOSPITAL_COMMUNITY): Payer: Medicaid Other

## 2023-07-11 ENCOUNTER — Ambulatory Visit (HOSPITAL_COMMUNITY)
Admission: EM | Admit: 2023-07-11 | Discharge: 2023-07-11 | Disposition: A | Discharge status: Home / Self Care | Payer: Medicaid Other | Attending: Family Medicine | Admitting: Family Medicine

## 2023-07-11 DIAGNOSIS — M546 Pain in thoracic spine: Secondary | ICD-10-CM | POA: Insufficient documentation

## 2023-07-11 DIAGNOSIS — M545 Low back pain, unspecified: Secondary | ICD-10-CM | POA: Insufficient documentation

## 2023-07-11 DIAGNOSIS — Z5321 Procedure and treatment not carried out due to patient leaving prior to being seen by health care provider: Secondary | ICD-10-CM | POA: Insufficient documentation

## 2023-07-11 DIAGNOSIS — R109 Unspecified abdominal pain: Secondary | ICD-10-CM | POA: Insufficient documentation

## 2023-07-11 LAB — POCT URINALYSIS DIP (MANUAL ENTRY)
Bilirubin, UA: NEGATIVE
Glucose, UA: NEGATIVE mg/dL
Ketones, POC UA: NEGATIVE mg/dL
Nitrite, UA: POSITIVE — AB
Protein Ur, POC: NEGATIVE mg/dL
Spec Grav, UA: 1.025 (ref 1.010–1.025)
Urobilinogen, UA: 1 U/dL
pH, UA: 5.5 (ref 5.0–8.0)

## 2023-07-11 LAB — COMPREHENSIVE METABOLIC PANEL
ALT: 16 U/L (ref 0–44)
AST: 20 U/L (ref 15–41)
Albumin: 3.4 g/dL — ABNORMAL LOW (ref 3.5–5.0)
Alkaline Phosphatase: 89 U/L (ref 38–126)
Anion gap: 8 (ref 5–15)
BUN: 13 mg/dL (ref 6–20)
CO2: 23 mmol/L (ref 22–32)
Calcium: 8.7 mg/dL — ABNORMAL LOW (ref 8.9–10.3)
Chloride: 105 mmol/L (ref 98–111)
Creatinine, Ser: 0.6 mg/dL (ref 0.44–1.00)
GFR, Estimated: >60 mL/min (ref >60)
Glucose, Bld: 127 mg/dL — ABNORMAL HIGH (ref 70–99)
Potassium: 3.5 mmol/L (ref 3.5–5.1)
Sodium: 136 mmol/L (ref 135–145)
Total Bilirubin: 0.4 mg/dL (ref 0.3–1.2)
Total Protein: 7.8 g/dL (ref 6.5–8.1)

## 2023-07-11 LAB — URINALYSIS, ROUTINE W REFLEX MICROSCOPIC
Bilirubin Urine: NEGATIVE
Glucose, UA: NEGATIVE mg/dL
Ketones, ur: NEGATIVE mg/dL
Nitrite: POSITIVE — AB
Protein, ur: NEGATIVE mg/dL
Specific Gravity, Urine: >1.03 — ABNORMAL HIGH (ref 1.005–1.030)
pH: 5 (ref 5.0–8.0)

## 2023-07-11 LAB — URINALYSIS, MICROSCOPIC (REFLEX)

## 2023-07-11 LAB — CBC
HCT: 40.4 % (ref 36.0–46.0)
Hemoglobin: 12.7 g/dL (ref 12.0–15.0)
MCH: 28.7 pg (ref 26.0–34.0)
MCHC: 31.4 g/dL (ref 30.0–36.0)
MCV: 91.4 fL (ref 80.0–100.0)
Platelets: 304 10*3/uL (ref 150–400)
RBC: 4.42 MIL/uL (ref 3.87–5.11)
RDW: 13.3 % (ref 11.5–15.5)
WBC: 8.7 10*3/uL (ref 4.0–10.5)
nRBC: 0 % (ref 0.0–0.2)

## 2023-07-11 LAB — LIPASE, BLOOD: Lipase: 26 U/L (ref 11–51)

## 2023-07-11 MED ORDER — KETOROLAC TROMETHAMINE 30 MG/ML IJ SOLN
30.0000 mg | Freq: Once | INTRAMUSCULAR | Status: AC
  Administered 2023-07-11: 30 mg via INTRAMUSCULAR

## 2023-07-11 MED ORDER — KETOROLAC TROMETHAMINE 30 MG/ML IJ SOLN
INTRAMUSCULAR | Status: AC
  Filled 2023-07-11: qty 1

## 2023-07-11 MED ORDER — KETOROLAC TROMETHAMINE 30 MG/ML IJ SOLN: 30.0000 mg | Freq: Once | INTRAMUSCULAR | Status: DC

## 2023-07-11 NOTE — ED Triage Notes (Signed)
Pt presents with R side flank pain that started last night. Pt denies any recent injuries or falls.

## 2023-07-11 NOTE — ED Triage Notes (Signed)
Pt reports right side lower back/flank pain since ysterday. Pt seen at [facility] and sent here for further eval. Denies n/v, pt eating cheetos in triage

## 2023-07-11 NOTE — ED Provider Triage Note (Signed)
Emergency Medicine Provider Triage Evaluation Note  Sarah Singh , a 57 y.o. female  was evaluated in triage.  Pt complains returns to ED for R upper back and flank pain.  Patient was seen earlier but left after triage.  She had UA and blood work done but left before having "scans done".  No nausea or vomiting.  Pain is intermittent and "shooting" in nature.  Review of Systems  Positive: As above Negative: As above  Physical Exam  BP 118/69 (BP Location: Left Arm)   Pulse 68   Temp 98.1 F (36.7 C)   Resp 20   LMP  (LMP Unknown)   SpO2 99%  Gen:   Awake, no distress   Resp:  Normal effort  MSK:   Moves extremities without difficulty  Other:  Patient currently eating chips  Medical Decision Making  Medically screening exam initiated at 9:52 PM.  Appropriate orders placed.  Sarah Singh was informed that the remainder of the evaluation will be completed by another provider, this initial triage assessment does not replace that evaluation, and the importance of remaining in the ED until their evaluation is complete.     Melton Alar R, [place] 07/11/23 2158

## 2023-07-11 NOTE — ED Notes (Signed)
Explained waiting room policy with patient. Patient stated she is leaving.

## 2023-07-11 NOTE — ED Triage Notes (Signed)
Patient coming to ED for evaluation of  L upper back/flank pain.  Reports she was seen earlier and had lab work completed.  Left prior to having "scans done."  No reports of nausea or vomiting.  Currently eating chips.  States pain is "shooting and comes and goes."

## 2023-07-12 NOTE — ED Notes (Signed)
Pts name called 3x for room placement. Pt no longer seen leaving the lobby. Pt last seen leaving the emergency room.

## 2023-07-13 NOTE — ED Provider Notes (Signed)
[facility]ARE CENTER   [id number] 07/11/23 Arrival Time: 2130  ASSESSMENT & PLAN:  1. Right flank pain    To ED for further evaluation. Cannot r/o kidney stone with infection. Appears to be in significant pain here. Declines  transport. By POV. Stable upon discharge. Meds ordered this encounter  Medications   DISCONTD: ketorolac (TORADOL) 30 MG/ML injection 30 mg   ketorolac (TORADOL) 30 MG/ML injection 30 mg   Results for orders placed or performed during the hospital encounter of 07/11/23  POC urinalysis dipstick  Result Value Ref Range   Color, UA straw (A) yellow   Clarity, UA cloudy (A) clear   Glucose, UA negative negative mg/dL   Bilirubin, UA negative negative   Ketones, POC UA negative negative mg/dL   Spec Grav, UA 8.657 8.469 - 1.025   Blood, UA small (A) negative   pH, UA 5.5 5.0 - 8.0   Protein Ur, POC negative negative mg/dL   Urobilinogen, UA 1.0 0.2 or 1.0 E.U./dL   Nitrite, UA Positive (A) Negative   Leukocytes, UA Small (1+) (A) Negative    Reviewed expectations re: course of current medical issues. Questions answered. Outlined signs and symptoms indicating need for more acute intervention. Patient verbalized understanding. After Visit Summary given.   SUBJECTIVE: History from: patient. Sarah Singh is a 57 y.o. female who presents with complaint of R flank pain; abrupt onset last evening. Denies trauma. Ques chills. Mild dysuria. Denies gross hematuria. Nausea without emesis. No tx PTA.  No LMP recorded (lmp unknown). Patient is postmenopausal.  Past Surgical History:  Procedure Laterality Date    TM tubes      TUBAL LIGATION  1992     OBJECTIVE:  Vitals:   07/11/23 0928  BP: (!) 142/80  Pulse: 97  Resp: 18  Temp: 98.1 F (36.7 C)  TempSrc: Oral  SpO2: 97%    General appearance: alert, oriented, appears to be in pain; leaning on counter the whole visit HEENT: [place]; AT; oropharynx moist Lungs: unlabored respirations Abdomen: obese;  soft; without distention; no specific tenderness to palpation; normal bowel sounds; without masses or organomegaly; without guarding or rebound tenderness Back: with reported R CVA tenderness; FROM at waist Extremities: without LE edema; symmetrical; without gross deformities Skin: warm and dry Neurologic: normal gait Psychological: alert and cooperative; normal mood and affect  Labs: Results for orders placed or performed during the hospital encounter of 07/11/23  POC urinalysis dipstick  Result Value Ref Range   Color, UA straw (A) yellow   Clarity, UA cloudy (A) clear   Glucose, UA negative negative mg/dL   Bilirubin, UA negative negative   Ketones, POC UA negative negative mg/dL   Spec Grav, UA 6.295 2.841 - 1.025   Blood, UA small (A) negative   pH, UA 5.5 5.0 - 8.0   Protein Ur, POC negative negative mg/dL   Urobilinogen, UA 1.0 0.2 or 1.0 E.U./dL   Nitrite, UA Positive (A) Negative   Leukocytes, UA Small (1+) (A) Negative   Labs Reviewed  POCT URINALYSIS DIP (MANUAL ENTRY) - Abnormal; Notable for the following components:      Result Value   Color, UA straw (*)    Clarity, UA cloudy (*)    Blood, UA small (*)    Nitrite, UA Positive (*)    Leukocytes, UA Small (1+) (*)    All other components within normal limits    Imaging: No results found.   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                                               Past Medical History:  Diagnosis Date   Arthritis    knees and hands   DM (diabetes mellitus), type 2 (HCC) 12/18/2014   Essential hypertension 07/25/2018   Prediabetes 12/18/2014   Sleep apnea, obstructive 2023   Vitamin D deficiency 2016    Social History   Socioeconomic History   Marital status: Single    Spouse name: Long term boyfriend   Number of children: 2   Years of education: 11   Highest education level: Not on file  Occupational History   Occupation:  Conservation officer, [employer]   Tobacco Use   Smoking status: Former    Current packs/day: 0.00    Types: Cigarettes    Start date: 03/08/1980    Quit date: 03/08/2013    Years since quitting: 10.3    Passive exposure: Current (Boyfriend)   Smokeless tobacco: Never  Vaping Use   Vaping status: Never Used  Substance and Sexual Activity   Alcohol use: Not Currently    Comment: History of abuse.  Stopped in 2010   Drug use: Not Currently    Types: Cocaine    Comment: no use since 2010.    Sexual activity: Yes    Birth control/protection: Surgical    Comment: one female partner for 6 years.  Has recurrent STIs with him.  She is now insisting on condoms.  Other Topics Concern   Not on file  Social History Narrative   Lives in an apartment by herself   Works at [employer] down the street.   Social Determinants of Health   Financial Resource Strain: Low Risk  (08/27/2022)   Overall Financial Resource Strain (CARDIA)    Difficulty of Paying Living Expenses: Not hard at all  Food Insecurity: No Food Insecurity (08/27/2022)   Hunger Vital Sign    Worried About Running Out of Food in the Last Year: Never true    Ran Out of Food in the Last Year: Never true  Transportation Needs: No Transportation Needs (08/27/2022)   PRAPARE - Administrator, Civil Service (Medical): No    Lack of Transportation (Non-Medical): No  Physical Activity: Not on file  Stress: Not on file  Social Connections: Unknown (04/28/2023)   Received [facility]   Social Network    Social Network: Not on file  Intimate Partner Violence: Unknown (04/28/2023)   Received from [facility]   HITS    Physically Hurt: Not on file    Insult or Talk Down To: Not on file    Threaten Physical Harm: Not on file    Scream or Curse: Not on file    Family History  Problem Relation Age of Onset   Arthritis Mother    Hypertension Mother    Diabetes Sister    Esophageal cancer Brother    Cancer Brother 55       throat cancer     Colon cancer Neg Hx    Colon polyps Neg Hx    Rectal cancer Neg Hx    Stomach cancer Neg Hx      Mardella Layman, MD 07/13/23 1050

## 2023-07-14 ENCOUNTER — Telehealth: Payer: Self-pay

## 2023-07-14 ENCOUNTER — Encounter (HOSPITAL_COMMUNITY): Payer: Self-pay

## 2023-07-14 ENCOUNTER — Emergency Department (HOSPITAL_COMMUNITY)
Admission: EM | Admit: 2023-07-14 | Discharge: 2023-07-14 | Disposition: A | Discharge status: Home / Self Care | Payer: Medicaid Other | Attending: Emergency Medicine | Admitting: Emergency Medicine

## 2023-07-14 ENCOUNTER — Other Ambulatory Visit: Payer: Self-pay

## 2023-07-14 DIAGNOSIS — M546 Pain in thoracic spine: Secondary | ICD-10-CM | POA: Diagnosis present

## 2023-07-14 DIAGNOSIS — R350 Frequency of micturition: Secondary | ICD-10-CM | POA: Insufficient documentation

## 2023-07-14 DIAGNOSIS — K8020 Calculus of gallbladder without cholecystitis without obstruction: Secondary | ICD-10-CM | POA: Diagnosis not present

## 2023-07-14 LAB — BASIC METABOLIC PANEL
Anion gap: 11 (ref 5–15)
BUN: 11 mg/dL (ref 6–20)
CO2: 24 mmol/L (ref 22–32)
Calcium: 9.4 mg/dL (ref 8.9–10.3)
Chloride: 104 mmol/L (ref 98–111)
Creatinine, Ser: 0.88 mg/dL (ref 0.44–1.00)
GFR, Estimated: >60 mL/min (ref >60)
Glucose, Bld: 166 mg/dL — ABNORMAL HIGH (ref 70–99)
Potassium: 3.5 mmol/L (ref 3.5–5.1)
Sodium: 139 mmol/L (ref 135–145)

## 2023-07-14 LAB — CBC
HCT: 39 % (ref 36.0–46.0)
Hemoglobin: 12.5 g/dL (ref 12.0–15.0)
MCH: 29.1 pg (ref 26.0–34.0)
MCHC: 32.1 g/dL (ref 30.0–36.0)
MCV: 90.7 fL (ref 80.0–100.0)
Platelets: 315 10*3/uL (ref 150–400)
RBC: 4.3 MIL/uL (ref 3.87–5.11)
RDW: 13.4 % (ref 11.5–15.5)
WBC: 10.7 10*3/uL — ABNORMAL HIGH (ref 4.0–10.5)
nRBC: 0 % (ref 0.0–0.2)

## 2023-07-14 LAB — URINALYSIS, ROUTINE W REFLEX MICROSCOPIC
Bilirubin Urine: NEGATIVE
Glucose, UA: NEGATIVE mg/dL
Ketones, ur: NEGATIVE mg/dL
Nitrite: NEGATIVE
Protein, ur: NEGATIVE mg/dL
Specific Gravity, Urine: 1.015 (ref 1.005–1.030)
WBC, UA: >50 WBC/hpf (ref 0–5)
pH: 5 (ref 5.0–8.0)

## 2023-07-14 MED ORDER — SULFAMETHOXAZOLE-TRIMETHOPRIM 800-160 MG PO TABS: 1.0000 | ORAL_TABLET | Freq: Two times a day (BID) | ORAL | 0 refills | Status: AC

## 2023-07-14 MED ORDER — GABAPENTIN 300 MG PO CAPS
300.0000 mg | ORAL_CAPSULE | Freq: Once | ORAL | Status: AC
  Administered 2023-07-14: 300 mg via ORAL
  Filled 2023-07-14: qty 1

## 2023-07-14 MED ORDER — PREDNISONE 20 MG PO TABS
60.0000 mg | ORAL_TABLET | Freq: Once | ORAL | Status: AC
  Administered 2023-07-14: 60 mg via ORAL
  Filled 2023-07-14: qty 3

## 2023-07-14 MED ORDER — SULFAMETHOXAZOLE-TRIMETHOPRIM 800-160 MG PO TABS
1.0000 | ORAL_TABLET | Freq: Once | ORAL | Status: AC
  Administered 2023-07-14: 1 via ORAL
  Filled 2023-07-14: qty 1

## 2023-07-14 MED ORDER — GABAPENTIN 300 MG PO CAPS: 300.0000 mg | ORAL_CAPSULE | Freq: Three times a day (TID) | ORAL | 0 refills | Status: DC | PRN

## 2023-07-14 MED ORDER — METHYLPREDNISOLONE 4 MG PO TBPK: ORAL_TABLET | ORAL | 0 refills | Status: DC

## 2023-07-14 MED ORDER — IBUPROFEN 400 MG PO TABS
600.0000 mg | ORAL_TABLET | Freq: Once | ORAL | Status: AC
  Administered 2023-07-14: 600 mg via ORAL
  Filled 2023-07-14: qty 1

## 2023-07-14 NOTE — Telephone Encounter (Signed)
Patient reports that she continues to have flank pain and urinary frequency. Patient denies hematuria, dysuria, vomiting, and fever. Patient has not yet been given medications in ED. Patient has gone back to ED for treatment.

## 2023-07-14 NOTE — ED Provider Notes (Signed)
[place] EMERGENCY DEPARTMENT AT [facility] Provider Note   CSN: [id number] Arrival date & time: 07/14/23  1402     History  No chief complaint on file.   Sarah Singh is a 57 y.o. female presenting to the ED with complaint of persistent mid back pain.  Patient reports that she developed this pain about 4 days ago she noticed when she woke up.  She says it hurts to move.  She has pain in the middle of her back that radiates bilaterally to the right and left flank.  She reports she has had some more urination than normal.  She denies that she has this pain before.  She denies any recent trauma or falls.  Denies abdominal surgeries.  Reports that she is diabetic on oral diabetic medication.  She was seen at an ED on Tuesday, 2 days ago, to CT renal stone study at the time, which showed no emergent findings but did note incidental gallstones.  She denies to me that she is having any right upper quadrant abdominal pain, nausea or vomiting.  She reports regular bowel movements.  HPI     Home Medications Prior to Admission medications   Medication Sig Start Date End Date Taking? Authorizing Provider  gabapentin (NEURONTIN) 300 MG capsule Take 1 capsule (300 mg total) by mouth 3 (three) times daily as needed for up to 21 doses. 07/14/23  Yes Terald Sleeper, MD  methylPREDNISolone (MEDROL DOSEPAK) 4 MG TBPK tablet Use as directed on package 07/15/23  Yes Nikesh Teschner, Kermit Balo, MD  sulfamethoxazole-trimethoprim (BACTRIM DS) 800-160 MG tablet Take 1 tablet by mouth 2 (two) times daily for 3 days. 07/15/23 07/18/23 Yes Terald Sleeper, MD  Calcium-Magnesium-Vitamin D 300-20-200 MG-MG-UNIT CHEW 2 chews by mouth twice daily. 08/19/21   Julieanne Manson, MD  Cholecalciferol (VITAMIN D3) 25 MCG (1000 UT) capsule Take 1 capsule (1,000 Units total) by mouth daily. 12/30/22   Julieanne Manson, MD  hydrochlorothiazide (HYDRODIURIL) 25 MG tablet Take 1 tablet by mouth once daily 11/25/22    Julieanne Manson, MD  ibuprofen (ADVIL) 800 MG tablet Take 1 tablet (800 mg total) by mouth every 8 (eight) hours as needed (pain). 05/24/23   Zenia Resides, MD  lidocaine (LIDODERM) 5 % Place 1 patch onto the skin daily. Remove & Discard patch within 12 hours 03/10/23   Rising, Lurena Joiner, PA-C  lisinopril (ZESTRIL) 10 MG tablet Take 1 tablet by mouth once daily 02/01/23   Julieanne Manson, MD  metFORMIN (GLUCOPHAGE-XR) 500 MG 24 hr tablet 1 tab by mouth twice daily with a meal 09/29/22   Julieanne Manson, MD  rosuvastatin (CRESTOR) 20 MG tablet Take 1 tablet (20 mg total) by mouth daily. 02/24/23   Julieanne Manson, MD      Allergies    Other    Review of Systems   Review of Systems  Physical Exam Updated Vital Signs BP 126/77 (BP Location: Left Arm)   Pulse 84   Temp 98.4 F (36.9 C) (Oral)   Resp 20   LMP  (LMP Unknown)   SpO2 100%  Physical Exam Constitutional:      General: She is not in acute distress. HENT:     Head: Normocephalic and atraumatic.  Eyes:     Conjunctiva/sclera: Conjunctivae normal.     Pupils: Pupils are equal, round, and reactive to light.  Cardiovascular:     Rate and Rhythm: Normal rate and regular rhythm.  Pulmonary:     Effort: Pulmonary  effort is normal. No respiratory distress.  Abdominal:     General: There is no distension.     Tenderness: There is no abdominal tenderness.  Musculoskeletal:     Comments: Pain is reproducible with sitting upright in bed, any movement of the waist +T spine and paraspinal tenderness bilaterally  Skin:    General: Skin is warm and dry.  Neurological:     General: No focal deficit present.     Mental Status: She is alert. Mental status is at baseline.  Psychiatric:        Mood and Affect: Mood normal.        Behavior: Behavior normal.     ED Results / Procedures / Treatments   Labs (all labs ordered are listed, but only abnormal results are displayed) Labs Reviewed  URINALYSIS, ROUTINE W  REFLEX MICROSCOPIC - Abnormal; Notable for the following components:      Result Value   APPearance CLOUDY (*)    Hgb urine dipstick MODERATE (*)    Leukocytes,Ua MODERATE (*)    Bacteria, UA RARE (*)    All other components within normal limits  BASIC METABOLIC PANEL - Abnormal; Notable for the following components:   Glucose, Bld 166 (*)    All other components within normal limits  CBC - Abnormal; Notable for the following components:   WBC 10.7 (*)    All other components within normal limits  URINE CULTURE    EKG None  Radiology No results found.  Procedures Procedures    Medications Ordered in ED Medications  predniSONE (DELTASONE) tablet 60 mg (has no administration in time range)  ibuprofen (ADVIL) tablet 600 mg (has no administration in time range)  gabapentin (NEURONTIN) capsule 300 mg (has no administration in time range)  sulfamethoxazole-trimethoprim (BACTRIM DS) 800-160 MG per tablet 1 tablet (has no administration in time range)    ED Course/ Medical Decision Making/ A&P                                 Medical Decision Making Amount and/or Complexity of Data Reviewed Labs: ordered.  Risk Prescription drug management.   Patient is presenting to the ED with reproducible and highly positional midthoracic back pain that radiates bilaterally.  Clinically this is consistent with a thoracic radiculopathy or likely thoracic herniated disc.  There is no traumatic mechanism to suggest a fracture of the thoracic spine.  No red flags to raise concern for cord compression or warrant emergent neuroimaging of the spine at this time.  Likewise have a low suspicion for acute cholecystitis.  The patient has no right upper quadrant abdominal tenderness, no GI symptoms.  We discussed that the gallstones are likely an incidental finding.  Labs ordered from triage and personally reviewed.  There is hemoglobin and moderate leukocytes noted in the urine, with bacteria that  are rare, significant squamous cells, this may be a contaminant, but with her urinary frequency it is reasonable to treat for UTI.  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  Patient already taking NSAIDS at maximum dose at home.  Can try medrol dose pack (short course) and gabapentin, but she will need PCP follow up.  9/9 renal stone study reviewed with incidental gallstones, no ureteral stone or other emergent findings        Final Clinical Impression(s) / ED  Diagnoses Final diagnoses:  Urinary frequency  Calculus of gallbladder without cholecystitis without obstruction    Rx / DC Orders ED Discharge Orders          Ordered    sulfamethoxazole-trimethoprim (BACTRIM DS) 800-160 MG tablet  2 times daily        07/14/23 1835    methylPREDNISolone (MEDROL DOSEPAK) 4 MG TBPK tablet        07/14/23 1835    gabapentin (NEURONTIN) 300 MG capsule  3 times daily PRN        07/14/23 1835              Terald Sleeper, MD 07/14/23 1836

## 2023-07-14 NOTE — Discharge Instructions (Addendum)
Please follow-up with your primary care clinic for your back pain.  This may be related to a bulging disc or pinched nerve in your thoracic spine.  You can continue taking the ibuprofen at home, as well as Tylenol.  I prescribed you a short course of steroids which may help with the pain; you can begin taking these medications as directed on the package tomorrow morning.  I also prescribed you a course of gabapentin which is a nerve pain medication.  Do not drive or operate machinery after taking this medication, as it can make some people drowsy or woozy.  You have incidental gallstones.  I gave you information to read about gallstones.  These do not appear to be the cause of your pain at this time.  However, if you do begin having nausea, vomiting, or pain on the right upper side of your abdomen, you should return to the ER.  Finally, your urine sample showed POSSIBLE signs of an infection.  We decided to start treating you with an antibiotic called Bactrim.  Please take this twice a day as directed until you complete the full course.

## 2023-07-14 NOTE — ED Triage Notes (Signed)
Pt state she was evaluated here on Tuesday , was told she had gallstones; states she left before being seen due to being in too much pain; c/o r flank pain; taking ibuprofen, muscle relaxer at home without relief; denies n/v, denies fevers

## 2023-07-17 LAB — URINE CULTURE: Culture: >=100000 — AB

## 2023-07-18 NOTE — Telephone Encounter (Signed)
Post ED Visit - Positive Culture Follow-up  Culture report reviewed by antimicrobial stewardship pharmacist: [facility] [x]  Gildardo Pounds, Pharm.D. []  Celedonio Miyamoto, Pharm.D., BCPS AQ-ID []  Garvin Fila, Pharm.D., BCPS []  Georgina Pillion, Pharm.D., BCPS []  [place], [address].D., BCPS, AAHIVP []  Estella Husk, Pharm.D., BCPS, AAHIVP []  Lysle Pearl, PharmD, BCPS []  Phillips Climes, PharmD, BCPS []  Agapito Games, PharmD, BCPS []  Verlan Friends, PharmD []  Mervyn Gay, PharmD, BCPS []  Vinnie Level, PharmD  [facility] []  Len Childs, PharmD []  Greer Pickerel, PharmD []  Adalberto Cole, PharmD []  Perlie Gold, Rph []  Lonell Face) Jean Rosenthal, PharmD []  Earl Many, PharmD []  Junita Push, PharmD []  Dorna Leitz, PharmD []  Terrilee Files, PharmD []  Lynann Beaver, PharmD []  Keturah Barre, PharmD []  Loralee Pacas, PharmD []  Bernadene Person, PharmD   Positive urine culture Treated with Sulfamethoxazole-Trimethoprim , organism sensitive to the same and no further patient follow-up is required at this time.  Bing Quarry 07/18/2023, 1:39 PM

## 2023-08-22 NOTE — Telephone Encounter (Signed)
Is she calling in here to see about an appt before going to ED?  She is using the ED an awful lot.

## 2023-08-23 ENCOUNTER — Encounter (HOSPITAL_COMMUNITY): Payer: Self-pay

## 2023-08-23 ENCOUNTER — Ambulatory Visit (HOSPITAL_COMMUNITY)
Admission: EM | Admit: 2023-08-23 | Discharge: 2023-08-23 | Disposition: A | Discharge status: Home / Self Care | Payer: Medicaid Other | Attending: Family Medicine | Admitting: Family Medicine

## 2023-08-23 DIAGNOSIS — M549 Dorsalgia, unspecified: Secondary | ICD-10-CM | POA: Diagnosis not present

## 2023-08-23 DIAGNOSIS — M79605 Pain in left leg: Secondary | ICD-10-CM

## 2023-08-23 DIAGNOSIS — W19XXXA Unspecified fall, initial encounter: Secondary | ICD-10-CM

## 2023-08-23 MED ORDER — KETOROLAC TROMETHAMINE 30 MG/ML IJ SOLN
30.0000 mg | Freq: Once | INTRAMUSCULAR | Status: AC
  Administered 2023-08-23: 30 mg via INTRAMUSCULAR

## 2023-08-23 MED ORDER — KETOROLAC TROMETHAMINE 30 MG/ML IJ SOLN
INTRAMUSCULAR | Status: AC
  Filled 2023-08-23: qty 1

## 2023-08-23 MED ORDER — IBUPROFEN 800 MG PO TABS: 800.0000 mg | ORAL_TABLET | Freq: Three times a day (TID) | ORAL | 0 refills | Status: DC

## 2023-08-23 MED ORDER — TIZANIDINE HCL 4 MG PO TABS: 4.0000 mg | ORAL_TABLET | Freq: Three times a day (TID) | ORAL | 0 refills | Status: DC | PRN

## 2023-08-23 NOTE — ED Provider Notes (Signed)
[facility]    CSN: [id number] Arrival date & time: 08/23/23  1156      History   Chief Complaint Chief Complaint  Patient presents with   Fall    HPI Sarah Singh is a 57 y.o. female.    Fall  She fell this morning at a store, there was a puddle of water.  She hurt her "whole left side and my back".  Her left leg bent back behind her.  She is able to move everything, no limited rom, she is just sore.  She did not hit her head.         Past Medical History:  Diagnosis Date   Arthritis    knees and hands   DM (diabetes mellitus), type 2 (HCC) 12/18/2014   Essential hypertension 07/25/2018   Prediabetes 12/18/2014   Sleep apnea, obstructive 2023   Vitamin D deficiency 2016    Patient Active Problem List   Diagnosis Date Noted   Vitamin D deficiency 02/27/2023   Hypercholesterolemia 12/30/2022   Snoring 02/25/2022   Chronic dental pain 02/25/2022   Chronic pain of both knees 08/19/2021   Right foot pain 08/19/2021   Lipoma of left upper extremity 10/09/2019   Class 3 severe obesity with serious comorbidity and body mass index (BMI) of 40.0 to 44.9 in adult (HCC) 06/24/2019   Decreased visual acuity 10/29/2018   Guaiac + stool 10/29/2018   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 Authorizing Provider  Calcium-Magnesium-Vitamin D 300-20-200 MG-MG-UNIT CHEW 2 chews by  mouth twice daily. 08/19/21   Julieanne Manson, MD  Cholecalciferol (VITAMIN D3) 25 MCG (1000 UT) capsule Take 1 capsule (1,000 Units total) by mouth daily. 12/30/22   Julieanne Manson, MD  gabapentin (NEURONTIN) 300 MG capsule Take 1 capsule (300 mg total) by mouth 3 (three) times daily as needed for up to 21 doses. 07/14/23   Terald Sleeper, MD  hydrochlorothiazide (HYDRODIURIL) 25 MG tablet Take 1 tablet by mouth once daily 11/25/22   Julieanne Manson, MD  ibuprofen (ADVIL) 800 MG tablet Take 1 tablet (800 mg total) by mouth every 8 (eight) hours as needed (pain). 05/24/23   Zenia Resides, MD  lidocaine (LIDODERM) 5 % Place 1 patch onto the skin daily. Remove & Discard patch within 12 hours 03/10/23   Rising, Lurena Joiner, PA-C  lisinopril (ZESTRIL) 10 MG tablet Take 1 tablet by mouth once daily 02/01/23   Julieanne Manson, MD  metFORMIN (GLUCOPHAGE-XR) 500 MG 24 hr tablet 1 tab by mouth twice daily with a meal 09/29/22  Julieanne Manson, MD  methylPREDNISolone (MEDROL DOSEPAK) 4 MG TBPK tablet Use as directed on package 07/15/23   Terald Sleeper, MD  rosuvastatin (CRESTOR) 20 MG tablet Take 1 tablet (20 mg total) by mouth daily. 02/24/23   Julieanne Manson, MD    Family History Family History  Problem Relation Age of Onset   Arthritis Mother    Hypertension Mother    Diabetes Sister    Esophageal cancer Brother    Cancer Brother 55       throat cancer    Colon cancer Neg Hx    Colon polyps Neg Hx    Rectal cancer Neg Hx    Stomach cancer Neg Hx     Social History Social History   Tobacco Use   Smoking status: Former    Current packs/day: 0.00    Types: Cigarettes    Start date: 03/08/1980    Quit date: 03/08/2013    Years since quitting: 10.4    Passive exposure: Current (Boyfriend)   Smokeless tobacco: Never  Vaping Use   Vaping status: Never Used  Substance Use Topics   Alcohol use: Not Currently    Comment: History of abuse.  Stopped in 2010   Drug use:  Not Currently    Types: Cocaine    Comment: no use since 2010.      Allergies   Other   Review of Systems Review of Systems  Constitutional: Negative.   HENT: Negative.    Respiratory: Negative.    Cardiovascular: Negative.   Gastrointestinal: Negative.   Genitourinary: Negative.   Musculoskeletal:  Positive for arthralgias, back pain and myalgias.     Physical Exam Triage Vital Signs ED Triage Vitals  Encounter Vitals Group     BP 08/23/23 1207 108/74     Systolic BP Percentile --      Diastolic BP Percentile --      Pulse Rate 08/23/23 1207 77     Resp 08/23/23 1207 16     Temp 08/23/23 1207 97.8 F (36.6 C)     Temp Source 08/23/23 1207 Oral     SpO2 08/23/23 1207 96 %     Weight --      Height --      Head Circumference --      Peak Flow --      Pain Score 08/23/23 1209 7     Pain Loc --      Pain Education --      Exclude from Growth Chart --    No data found.  Updated Vital Signs BP 108/74 (BP Location: Left Arm)   Pulse 77   Temp 97.8 F (36.6 C) (Oral)   Resp 16   LMP  (LMP Unknown)   SpO2 96%   Visual Acuity Right Eye Distance:   Left Eye Distance:   Bilateral Distance:    Right Eye Near:   Left Eye Near:    Bilateral Near:     Physical Exam Constitutional:      Appearance: Normal appearance.  Cardiovascular:     Rate and Rhythm: Normal rate and regular rhythm.  Pulmonary:     Effort: Pulmonary effort is normal.     Breath sounds: Normal breath sounds.  Musculoskeletal:        General: No swelling or deformity.     Comments: She has TTP down the left side of the back, the left shoulder/arm, left hip, knee, and leg;  she has full rom of the  neck;  full rom of all other joints without limitation;  she is able to bear weight and ambulate normally  Skin:    General: Skin is warm.  Neurological:     General: No focal deficit present.     Mental Status: She is alert.  Psychiatric:        Mood and Affect: Mood normal.      UC  Treatments / Results  Labs (all labs ordered are listed, but only abnormal results are displayed) Labs Reviewed - No data to display  EKG   Radiology No results found.  Procedures Procedures (including critical care time)  Medications Ordered in UC Medications  ketorolac (TORADOL) 30 MG/ML injection 30 mg (has no administration in time range)    Initial Impression / Assessment and Plan / UC Course  I have reviewed the triage vital signs and the nursing notes.  Pertinent labs & imaging results that were available during my care of the patient were reviewed by me and considered in my medical decision making (see chart for details).   Final Clinical Impressions(s) / UC Diagnoses   Final diagnoses:  Pain of left lower extremity  Acute left-sided back pain, unspecified back location  Fall, initial encounter     Discharge Instructions      You were seen today after a fall.  You were given a shot of pain medication in the office.  I have sent out 800mg  motrin, and a muscle relaxer to help.  This may  make you tired/sleepy so take when you're home and not driving.  I recommend rest, and using a heating pad/ice pack as needed.     ED Prescriptions     Medication Sig Dispense Auth. Provider   ibuprofen (ADVIL) 800 MG tablet Take 1 tablet (800 mg total) by mouth 3 (three) times daily. 21 tablet Avalyn Molino, MD   tiZANidine (ZANAFLEX) 4 MG tablet Take 1 tablet (4 mg total) by mouth every 8 (eight) hours as needed for muscle spasms. 30 tablet Jannifer Franklin, MD      PDMP not reviewed this encounter.   Jannifer Franklin, MD 08/23/23 1228

## 2023-08-23 NOTE — Discharge Instructions (Signed)
You were seen today after a fall.  You were given a shot of pain medication in the office.  I have sent out 800mg  motrin, and a muscle relaxer to help.  This may  make you tired/sleepy so take when you're home and not driving.  I recommend rest, and using a heating pad/ice pack as needed.

## 2023-08-23 NOTE — ED Triage Notes (Signed)
Pt states she slipped and fell today denies hitting her head or LOC.  C/O pain to left side of her body and her back. Pt ambulates with a steady gait.

## 2023-08-31 ENCOUNTER — Encounter: Payer: Self-pay | Admitting: Internal Medicine

## 2023-08-31 ENCOUNTER — Ambulatory Visit: Payer: Medicaid Other | Admitting: Internal Medicine

## 2023-08-31 VITALS — BP 138/78 | HR 72 | Resp 16 | Ht 63.75 in | Wt 247.0 lb

## 2023-08-31 DIAGNOSIS — Z23 Encounter for immunization: Secondary | ICD-10-CM

## 2023-08-31 DIAGNOSIS — G4733 Obstructive sleep apnea (adult) (pediatric): Secondary | ICD-10-CM

## 2023-08-31 DIAGNOSIS — E119 Type 2 diabetes mellitus without complications: Secondary | ICD-10-CM

## 2023-08-31 DIAGNOSIS — Z0000 Encounter for general adult medical examination without abnormal findings: Secondary | ICD-10-CM

## 2023-08-31 DIAGNOSIS — M170 Bilateral primary osteoarthritis of knee: Secondary | ICD-10-CM

## 2023-08-31 DIAGNOSIS — E7800 Pure hypercholesterolemia, unspecified: Secondary | ICD-10-CM

## 2023-08-31 DIAGNOSIS — I10 Essential (primary) hypertension: Secondary | ICD-10-CM | POA: Diagnosis not present

## 2023-08-31 DIAGNOSIS — Z6841 Body Mass Index (BMI) 40.0 and over, adult: Secondary | ICD-10-CM

## 2023-08-31 DIAGNOSIS — E66813 Obesity, class 3: Secondary | ICD-10-CM
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
## 2023-08-31 NOTE — Patient Instructions (Signed)
Water exercise--walk laps in 3-4 feet of water and [facility] [employer].  Please think about starting Ozempic or similar med for DM and weight loss, which will help your knees.

## 2023-09-09 ENCOUNTER — Other Ambulatory Visit: Payer: Self-pay

## 2023-09-09 MED ORDER — TERBINAFINE HCL 1 % EX CREA: 1.0000 | TOPICAL_CREAM | Freq: Two times a day (BID) | CUTANEOUS | 1 refills | Status: AC

## 2023-09-15 ENCOUNTER — Encounter: Payer: Self-pay | Admitting: Internal Medicine

## 2023-09-15 ENCOUNTER — Ambulatory Visit: Payer: Medicaid Other | Admitting: Internal Medicine

## 2023-09-15 VITALS — BP 114/70 | HR 68 | Resp 20 | Ht 63.75 in | Wt 248.0 lb

## 2023-09-15 DIAGNOSIS — L239 Allergic contact dermatitis, unspecified cause: Secondary | ICD-10-CM

## 2023-09-15 MED ORDER — TRIAMCINOLONE ACETONIDE 0.1 % EX CREA: 1.0000 | TOPICAL_CREAM | Freq: Two times a day (BID) | CUTANEOUS | 0 refills | Status: DC

## 2023-09-15 NOTE — Progress Notes (Signed)
    Subjective:    Patient ID: Sarah Singh, female   DOB: 1966-01-22, 57 y.o.   MRN: [id number]   HPI  Itchy rash starting 3 days ago.  No definite new exposure, though does change detergents and lotions, etc.  Has treated with cortisone cream and alcohol with mild improvement with the former and burning and discomfort with the latter  Current Meds  Medication Sig   Calcium-Magnesium-Vitamin D 300-20-200 MG-MG-UNIT CHEW 2 chews by mouth twice daily.   Cholecalciferol (VITAMIN D3) 25 MCG (1000 UT) capsule Take 1 capsule (1,000 Units total) by mouth daily.   hydrochlorothiazide (HYDRODIURIL) 25 MG tablet Take 1 tablet by mouth once daily   ibuprofen (ADVIL) 800 MG tablet Take 1 tablet (800 mg total) by mouth 3 (three) times daily.   lisinopril (ZESTRIL) 10 MG tablet Take 1 tablet by mouth once daily   metFORMIN (GLUCOPHAGE-XR) 500 MG 24 hr tablet 1 tab by mouth twice daily with a meal   rosuvastatin (CRESTOR) 20 MG tablet Take 1 tablet (20 mg total) by mouth daily.   terbinafine (LAMISIL) 1 % cream Apply 1 Application topically 2 (two) times daily for 14 days.   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  No weeping  Dermarcated edge where stops and starts  Assessment & Plan  Contact dermatitis most likely cause:  Triamcinolone cream 0.1% twice daily to area.  Keep cool and dry.  Call if worsens or does not improve.

## 2023-09-20 ENCOUNTER — Other Ambulatory Visit: Payer: Medicaid Other

## 2023-09-20 ENCOUNTER — Other Ambulatory Visit: Payer: Self-pay | Admitting: Internal Medicine

## 2023-09-20 DIAGNOSIS — E119 Type 2 diabetes mellitus without complications: Secondary | ICD-10-CM | POA: Diagnosis not present

## 2023-09-20 DIAGNOSIS — E7800 Pure hypercholesterolemia, unspecified: Secondary | ICD-10-CM

## 2023-09-20 DIAGNOSIS — Z0000 Encounter for general adult medical examination without abnormal findings: Secondary | ICD-10-CM | POA: Diagnosis not present

## 2023-09-20 DIAGNOSIS — Z1231 Encounter for screening mammogram for malignant neoplasm of breast: Secondary | ICD-10-CM

## 2023-09-21 ENCOUNTER — Other Ambulatory Visit (INDEPENDENT_AMBULATORY_CARE_PROVIDER_SITE_OTHER): Payer: Medicaid Other

## 2023-09-21 ENCOUNTER — Ambulatory Visit (INDEPENDENT_AMBULATORY_CARE_PROVIDER_SITE_OTHER): Payer: Medicaid Other | Admitting: Orthopaedic Surgery

## 2023-09-21 DIAGNOSIS — G8929 Other chronic pain: Secondary | ICD-10-CM

## 2023-09-21 DIAGNOSIS — M25562 Pain in left knee: Secondary | ICD-10-CM

## 2023-09-21 DIAGNOSIS — M25561 Pain in right knee: Secondary | ICD-10-CM

## 2023-09-21 LAB — COMPREHENSIVE METABOLIC PANEL
ALT: 12 [IU]/L (ref 0–32)
AST: 15 [IU]/L (ref 0–40)
Albumin: 3.8 g/dL (ref 3.8–4.9)
Alkaline Phosphatase: 112 [IU]/L (ref 44–121)
BUN/Creatinine Ratio: 18 (ref 9–23)
BUN: 12 mg/dL (ref 6–24)
Bilirubin Total: 0.5 mg/dL (ref 0.0–1.2)
CO2: 24 mmol/L (ref 20–29)
Calcium: 9.8 mg/dL (ref 8.7–10.2)
Chloride: 104 mmol/L (ref 96–106)
Creatinine, Ser: 0.67 mg/dL (ref 0.57–1.00)
Globulin, Total: 3.5 g/dL (ref 1.5–4.5)
Glucose: 140 mg/dL — ABNORMAL HIGH (ref 70–99)
Potassium: 4 mmol/L (ref 3.5–5.2)
Sodium: 142 mmol/L (ref 134–144)
Total Protein: 7.3 g/dL (ref 6.0–8.5)
eGFR: 103 mL/min/{1.73_m2} (ref >59)

## 2023-09-21 LAB — CBC WITH DIFFERENTIAL/PLATELET
Basophils Absolute: 0 10*3/uL (ref 0.0–0.2)
Basos: 1 %
EOS (ABSOLUTE): 0.2 10*3/uL (ref 0.0–0.4)
Eos: 2 %
Hematocrit: 39.2 % (ref 34.0–46.6)
Hemoglobin: 12.4 g/dL (ref 11.1–15.9)
Immature Grans (Abs): 0 10*3/uL (ref 0.0–0.1)
Immature Granulocytes: 0 %
Lymphocytes Absolute: 3.5 10*3/uL — ABNORMAL HIGH (ref 0.7–3.1)
Lymphs: 40 %
MCH: 29.3 pg (ref 26.6–33.0)
MCHC: 31.6 g/dL (ref 31.5–35.7)
MCV: 93 fL (ref 79–97)
Monocytes Absolute: 0.5 10*3/uL (ref 0.1–0.9)
Monocytes: 6 %
Neutrophils Absolute: 4.4 10*3/uL (ref 1.4–7.0)
Neutrophils: 51 %
Platelets: 269 10*3/uL (ref 150–450)
RBC: 4.23 x10E6/uL (ref 3.77–5.28)
RDW: 13.7 % (ref 11.7–15.4)
WBC: 8.6 10*3/uL (ref 3.4–10.8)

## 2023-09-21 LAB — HEMOGLOBIN A1C
Est. average glucose Bld gHb Est-mCnc: 148 mg/dL
Hgb A1c MFr Bld: 6.8 % — ABNORMAL HIGH (ref 4.8–5.6)

## 2023-09-21 LAB — MICROALBUMIN / CREATININE URINE RATIO
Creatinine, Urine: 156.2 mg/dL
Microalb/Creat Ratio: 4 mg/g{creat} (ref 0–29)
Microalbumin, Urine: 5.7 ug/mL

## 2023-09-21 LAB — LIPID PANEL W/O CHOL/HDL RATIO
Cholesterol, Total: 123 mg/dL (ref 100–199)
HDL: 50 mg/dL (ref >39)
LDL Chol Calc (NIH): 57 mg/dL (ref 0–99)
Triglycerides: 82 mg/dL (ref 0–149)
VLDL Cholesterol Cal: 16 mg/dL (ref 5–40)
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
## 2023-09-26 ENCOUNTER — Telehealth: Payer: Self-pay

## 2023-09-26 NOTE — Telephone Encounter (Signed)
Patient called to report that her abdominal rash has gotten worse. Rash looks darker (black), still itchy, dry skin, seems to be spreading. Patient has been using triamcinolone cream, which has not helped any. Patient would like recommendations.

## 2023-09-27 ENCOUNTER — Ambulatory Visit (INDEPENDENT_AMBULATORY_CARE_PROVIDER_SITE_OTHER): Payer: Medicaid Other | Admitting: Internal Medicine

## 2023-09-27 VITALS — BP 122/80 | HR 72 | Resp 20 | Ht 63.75 in | Wt 251.0 lb

## 2023-09-27 DIAGNOSIS — L239 Allergic contact dermatitis, unspecified cause: Secondary | ICD-10-CM

## 2023-09-28 NOTE — Telephone Encounter (Signed)
Patient has been seen.

## 2023-10-07 ENCOUNTER — Other Ambulatory Visit: Payer: Self-pay | Admitting: Internal Medicine

## 2023-10-14 ENCOUNTER — Other Ambulatory Visit: Payer: Self-pay

## 2023-10-21 ENCOUNTER — Encounter: Payer: Self-pay | Admitting: Internal Medicine

## 2023-10-21 NOTE — Progress Notes (Signed)
    Subjective:    Patient ID: Sarah Singh, female   DOB: July 15, 1966, 57 y.o.   MRN: [id number]   HPI  Concern as the rash under right lateral and anterior abdominal pannus does not seem to be resolving.  Turning dark.  No new lesions and erythema has gone.  + flaking and still with some itching.  No outpatient medications have been marked as taking for the 09/27/23 encounter (Office Visit) with Julieanne Manson, MD.   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  No erythema or raised lesions.  Mild hyperpigmentation.   Assessment & Plan  Resolving contact dermatitis with mild postinflammatory hyperpigmentation and flaking.  Discussed the discoloration and flaking should resolve with time.

## 2023-10-24 ENCOUNTER — Ambulatory Visit
Admission: RE | Admit: 2023-10-24 | Discharge: 2023-10-24 | Disposition: A | Discharge status: Home / Self Care | Payer: Medicaid Other | Source: Ambulatory Visit | Attending: Internal Medicine | Admitting: Internal Medicine

## 2023-10-24 DIAGNOSIS — Z1231 Encounter for screening mammogram for malignant neoplasm of breast: Secondary | ICD-10-CM

## 2023-12-12 LAB — OPHTHALMOLOGY REPORT-SCANNED

## 2023-12-19 ENCOUNTER — Other Ambulatory Visit: Payer: Self-pay

## 2023-12-19 MED ORDER — ROSUVASTATIN CALCIUM 20 MG PO TABS: 20.0000 mg | ORAL_TABLET | Freq: Every day | ORAL | 3 refills | Status: AC

## 2023-12-19 MED ORDER — METFORMIN HCL ER 500 MG PO TB24: ORAL_TABLET | ORAL | 10 refills | Status: DC

## 2023-12-19 MED ORDER — HYDROCHLOROTHIAZIDE 25 MG PO TABS: 25.0000 mg | ORAL_TABLET | Freq: Every day | ORAL | 10 refills | Status: DC

## 2023-12-22 ENCOUNTER — Telehealth: Payer: Self-pay

## 2023-12-22 NOTE — Telephone Encounter (Signed)
Patient would like to talk to PCP about switching metformin to [place]. Patient was told by a friend that Metformin causes cancer so she is now worried about continuing medication.

## 2023-12-22 NOTE — Telephone Encounter (Signed)
Reassured patient that metformin causing cancer is not true. Explained to patient that metformin has been around for a long time and has been good at helping control diabetes. Patient expressed that she feels better about medication.

## 2024-01-16 ENCOUNTER — Telehealth: Payer: Self-pay

## 2024-01-16 MED ORDER — HYDROCHLOROTHIAZIDE 25 MG PO TABS: 25.0000 mg | ORAL_TABLET | Freq: Every day | ORAL | 10 refills | Status: DC

## 2024-01-16 NOTE — Telephone Encounter (Signed)
 Patient called states she needs refill on hydrochlorothiazide medication sent to [facility]

## 2024-01-23 ENCOUNTER — Emergency Department (HOSPITAL_COMMUNITY)

## 2024-01-23 ENCOUNTER — Encounter (HOSPITAL_COMMUNITY): Payer: Self-pay

## 2024-01-23 ENCOUNTER — Other Ambulatory Visit: Payer: Self-pay

## 2024-01-23 ENCOUNTER — Emergency Department (HOSPITAL_COMMUNITY)
Admission: EM | Admit: 2024-01-23 | Discharge: 2024-01-24 | Discharge status: Against Medical Advice | Attending: Emergency Medicine | Admitting: Emergency Medicine

## 2024-01-23 DIAGNOSIS — M79601 Pain in right arm: Secondary | ICD-10-CM | POA: Insufficient documentation

## 2024-01-23 DIAGNOSIS — Z87891 Personal history of nicotine dependence: Secondary | ICD-10-CM | POA: Diagnosis not present

## 2024-01-23 DIAGNOSIS — I10 Essential (primary) hypertension: Secondary | ICD-10-CM | POA: Diagnosis not present

## 2024-01-23 DIAGNOSIS — Z5321 Procedure and treatment not carried out due to patient leaving prior to being seen by health care provider: Secondary | ICD-10-CM | POA: Diagnosis not present

## 2024-01-23 DIAGNOSIS — R079 Chest pain, unspecified: Secondary | ICD-10-CM | POA: Insufficient documentation

## 2024-01-23 DIAGNOSIS — R0789 Other chest pain: Secondary | ICD-10-CM | POA: Diagnosis present

## 2024-01-23 DIAGNOSIS — Z79899 Other long term (current) drug therapy: Secondary | ICD-10-CM | POA: Diagnosis not present

## 2024-01-23 DIAGNOSIS — E119 Type 2 diabetes mellitus without complications: Secondary | ICD-10-CM | POA: Diagnosis not present

## 2024-01-23 DIAGNOSIS — Z7984 Long term (current) use of oral hypoglycemic drugs: Secondary | ICD-10-CM | POA: Diagnosis not present

## 2024-01-23 LAB — CBC
HCT: 41.4 % (ref 36.0–46.0)
Hemoglobin: 13 g/dL (ref 12.0–15.0)
MCH: 29.2 pg (ref 26.0–34.0)
MCHC: 31.4 g/dL (ref 30.0–36.0)
MCV: 93 fL (ref 80.0–100.0)
Platelets: 325 10*3/uL (ref 150–400)
RBC: 4.45 MIL/uL (ref 3.87–5.11)
RDW: 13.5 % (ref 11.5–15.5)
WBC: 13.4 10*3/uL — ABNORMAL HIGH (ref 4.0–10.5)
nRBC: 0 % (ref 0.0–0.2)

## 2024-01-23 LAB — BASIC METABOLIC PANEL
Anion gap: 10 (ref 5–15)
BUN: 16 mg/dL (ref 6–20)
CO2: 25 mmol/L (ref 22–32)
Calcium: 9.5 mg/dL (ref 8.9–10.3)
Chloride: 105 mmol/L (ref 98–111)
Creatinine, Ser: 0.82 mg/dL (ref 0.44–1.00)
GFR, Estimated: >60 mL/min (ref >60)
Glucose, Bld: 98 mg/dL (ref 70–99)
Potassium: 4.1 mmol/L (ref 3.5–5.1)
Sodium: 140 mmol/L (ref 135–145)

## 2024-01-23 LAB — TROPONIN I (HIGH SENSITIVITY): Troponin I (High Sensitivity): 3 ng/L (ref <18)

## 2024-01-23 MED ORDER — NAPROXEN 250 MG PO TABS
500.0000 mg | ORAL_TABLET | Freq: Once | ORAL | Status: AC
  Administered 2024-01-23: 500 mg via ORAL
  Filled 2024-01-23: qty 2

## 2024-01-23 NOTE — ED Provider Triage Note (Signed)
 Emergency Medicine Provider Triage Evaluation Note  SHAVANNA FURNARI , a 57 y.o. female  was evaluated in triage.  Pt complains of chest pain.  Patient states that pain began this morning at approximately 6 AM.  Radiates to the right arm.  Worse with movement of the right arm.  Received aspirin by EMS.  Denies numbness, tingling, weakness, shortness of breath, nausea, vomiting, diaphoresis  Review of Systems  Positive: Chest pain, arm pain Negative: Shortness of breath, nausea, vomiting  Physical Exam  BP 124/71 (BP Location: Left Arm)   Pulse 69   Temp 98.4 F (36.9 C) (Oral)   Resp 16   Ht 5' 3.75" (1.619 m)   Wt 113.9 kg   LMP  (LMP Unknown)   SpO2 100%   BMI 43.42 kg/m  Gen:   Awake, no distress   Resp:  Normal effort  MSK:   Moves extremities without difficulty  Other:    Medical Decision Making  Medically screening exam initiated at 7:37 PM.  Appropriate orders placed.  LOUISE RAWSON was informed that the remainder of the evaluation will be completed by another provider, this initial triage assessment does not replace that evaluation, and the importance of remaining in the ED until their evaluation is complete.     Glendora Score, MD 01/23/24 [phone]

## 2024-01-23 NOTE — ED Triage Notes (Signed)
 PER EMS: pt is from home with c/o central chest pain that started when she woke up this morning around 0600 that radiates into her right arm, worse with movement. She took 800 of Motrin today without relief. She received 324 asa by ems  BP- 131/67, HR-73, RR-18, 97% RA, CBG-111

## 2024-01-23 NOTE — ED Notes (Signed)
 Called 3x no answer for trop

## 2024-01-23 NOTE — ED Notes (Signed)
 KCalled pt x3, no answer.  KM

## 2024-01-24 ENCOUNTER — Emergency Department (HOSPITAL_COMMUNITY)
Admission: EM | Admit: 2024-01-24 | Discharge: 2024-01-24 | Disposition: A | Discharge status: Home / Self Care | Attending: Emergency Medicine | Admitting: Emergency Medicine

## 2024-01-24 ENCOUNTER — Emergency Department (HOSPITAL_COMMUNITY)

## 2024-01-24 ENCOUNTER — Other Ambulatory Visit: Payer: Self-pay

## 2024-01-24 ENCOUNTER — Telehealth: Payer: Self-pay

## 2024-01-24 DIAGNOSIS — Z79899 Other long term (current) drug therapy: Secondary | ICD-10-CM | POA: Insufficient documentation

## 2024-01-24 DIAGNOSIS — R0789 Other chest pain: Secondary | ICD-10-CM | POA: Insufficient documentation

## 2024-01-24 DIAGNOSIS — I10 Essential (primary) hypertension: Secondary | ICD-10-CM | POA: Insufficient documentation

## 2024-01-24 DIAGNOSIS — R079 Chest pain, unspecified: Secondary | ICD-10-CM | POA: Diagnosis not present

## 2024-01-24 DIAGNOSIS — Z87891 Personal history of nicotine dependence: Secondary | ICD-10-CM | POA: Insufficient documentation

## 2024-01-24 DIAGNOSIS — E119 Type 2 diabetes mellitus without complications: Secondary | ICD-10-CM | POA: Insufficient documentation

## 2024-01-24 DIAGNOSIS — Z7984 Long term (current) use of oral hypoglycemic drugs: Secondary | ICD-10-CM | POA: Insufficient documentation

## 2024-01-24 LAB — TROPONIN I (HIGH SENSITIVITY): Troponin I (High Sensitivity): <2 ng/L (ref <18)

## 2024-01-24 MED ORDER — KETOROLAC TROMETHAMINE 30 MG/ML IJ SOLN: 30.0000 mg | Freq: Once | INTRAMUSCULAR | Status: DC

## 2024-01-24 MED ORDER — ACETAMINOPHEN 500 MG PO TABS
1000.0000 mg | ORAL_TABLET | Freq: Once | ORAL | Status: AC
Start: 2024-01-24 — End: 2024-01-24
  Administered 2024-01-24: 1000 mg via ORAL
  Filled 2024-01-24: qty 2

## 2024-01-24 MED ORDER — KETOROLAC TROMETHAMINE 30 MG/ML IJ SOLN
30.0000 mg | Freq: Once | INTRAMUSCULAR | Status: AC
Start: 2024-01-24 — End: 2024-01-24
  Administered 2024-01-24: 30 mg via INTRAMUSCULAR
  Filled 2024-01-24: qty 1

## 2024-01-24 NOTE — ED Triage Notes (Signed)
 Pt. Stated, Ive had chest pain since yesterday denies any other symptoms. I left yesterday before being discharge, I had to leave. Im still having the pain.

## 2024-01-24 NOTE — ED Notes (Signed)
 PT otf with transport in no new onset distress.

## 2024-01-24 NOTE — Discharge Instructions (Signed)
 While you are in the emergency room, you have blood work done that was normal.  Your EKG was normal.  Your chest x-ray showed some arthritis in your shoulder.  I would recommend taking Motrin and Tylenol at home over the next few days.  Please follow-up with your PCP within 1 week.  Return to the emergency room for any increased difficulty with your breathing, new or different chest pain.

## 2024-01-24 NOTE — Telephone Encounter (Signed)
 Pcp is aware of information

## 2024-01-24 NOTE — ED Provider Notes (Signed)
  EMERGENCY DEPARTMENT AT [facility] Provider Note  CSN: [id number] Arrival date & time: 01/24/24 4259  Chief Complaint(s) Chest Pain  HPI Sarah Singh is a 58 y.o. female here today for pain over the right side of her chest wall.  Symptoms began yesterday.  She came to the emergency room, left prior to her workup being completed.  Did have blood work drawn yesterday that I reviewed.  Patient says she was sitting there, began to have pain.  Says this happened couple of years ago.  She denies any dyspnea, orthopnea, nausea.   Past Medical History Past Medical History:  Diagnosis Date   Arthritis    knees and hands   DM (diabetes mellitus), type 2 (HCC) 12/18/2014   Essential hypertension 07/25/2018   OA (osteoarthritis) of knee 2022   bilateral   Prediabetes 12/18/2014   Sleep apnea, obstructive 2023   Vitamin D deficiency 2016   Patient Active Problem List   Diagnosis Date Noted   Vitamin D deficiency 02/27/2023   Hypercholesterolemia 12/30/2022   Snoring 02/25/2022   Chronic dental pain 02/25/2022   Chronic pain of both knees 08/19/2021   Right foot pain 08/19/2021   OA (osteoarthritis) of knee 2022   Lipoma of left upper extremity 10/09/2019   Class 3 severe obesity with serious comorbidity and body mass index (BMI) of 40.0 to 44.9 in adult (HCC) 06/24/2019   Decreased visual acuity 10/29/2018   Guaiac + stool 10/29/2018   Ulnar neuropathy at elbow of right upper extremity 07/25/2018   Primary hypertension 07/25/2018   Bilateral carpal tunnel syndrome 07/25/2018   Wax in ear 12/18/2014   Hot flashes 12/18/2014   DM (diabetes mellitus), type 2 (HCC) 12/18/2014   Home Medication(s) Prior to Admission medications   Medication Sig Start Date End Date Taking? Authorizing Provider  Calcium-Magnesium-Vitamin D 300-20-200 MG-MG-UNIT CHEW 2 chews by mouth twice daily. 08/19/21  Yes Julieanne Manson, MD  Cholecalciferol (VITAMIN D3) 25 MCG (1000 UT)  capsule Take 1 capsule (1,000 Units total) by mouth daily. 12/30/22  Yes Julieanne Manson, MD  hydrochlorothiazide (HYDRODIURIL) 25 MG tablet Take 1 tablet (25 mg total) by mouth daily. 01/16/24  Yes Julieanne Manson, MD  ibuprofen (ADVIL) 800 MG tablet Take 1 tablet (800 mg total) by mouth 3 (three) times daily. 08/23/23  Yes Piontek, Denny Peon, MD  lisinopril (ZESTRIL) 10 MG tablet Take 1 tablet by mouth once daily 02/01/23  Yes Julieanne Manson, MD  metFORMIN (GLUCOPHAGE-XR) 500 MG 24 hr tablet 1 tab by mouth twice daily with a meal 12/19/23  Yes Julieanne Manson, MD  rosuvastatin (CRESTOR) 20 MG tablet Take 1 tablet (20 mg total) by mouth daily. 12/19/23  Yes Julieanne Manson, MD                                                                                                                                    Past Surgical  History Past Surgical History:  Procedure Laterality Date    TM tubes      TUBAL LIGATION  1992   Family History Family History  Problem Relation Age of Onset   Arthritis Mother    Hypertension Mother    Diabetes Sister    Cancer Brother 24       throat cancer    Colon cancer Neg Hx    Colon polyps Neg Hx    Rectal cancer Neg Hx    Stomach cancer Neg Hx     Social History Social History   Tobacco Use   Smoking status: Former    Current packs/day: 0.00    Types: Cigarettes    Start date: 03/08/1980    Quit date: 03/08/2013    Years since quitting: 10.8    Passive exposure: Current (Boyfriend)   Smokeless tobacco: Never  Vaping Use   Vaping status: Never Used  Substance Use Topics   Alcohol use: Not Currently    Comment: History of abuse.  Stopped in 2010   Drug use: Not Currently    Types: Cocaine    Comment: no use since 2010.    Allergies Other  Review of Systems Review of Systems  Physical Exam Vital Signs  I have reviewed the triage vital signs BP (!) 144/83   Pulse 66   Temp 98.7 F (37.1 C) (Oral)   Resp 16   LMP  (LMP Unknown)    SpO2 100%   Physical Exam Vitals reviewed.  Cardiovascular:     Rate and Rhythm: Normal rate.     Heart sounds: Normal heart sounds.     No friction rub.  Pulmonary:     Effort: No tachypnea.     Breath sounds: Normal breath sounds. No decreased breath sounds.  Chest:     Chest wall: Tenderness present. No mass.     Comments: Tenderness over the right side of the chest wall Abdominal:     Palpations: Abdomen is soft.  Musculoskeletal:     Cervical back: Normal range of motion.  Neurological:     Mental Status: She is alert.     ED Results and Treatments Labs (all labs ordered are listed, but only abnormal results are displayed) Labs Reviewed  TROPONIN I (HIGH SENSITIVITY)                                                                                                                          Radiology DG Shoulder Right Result Date: 01/24/2024 CLINICAL DATA:  Right chest and shoulder pain. EXAM: RIGHT SHOULDER - 2+ VIEW COMPARISON:  None Available. FINDINGS: No acute fracture or dislocation. Mild glenohumeral and acromioclavicular degenerative changes. Soft tissues are unremarkable. IMPRESSION: 1. Mild glenohumeral and acromioclavicular osteoarthritis. Electronically Signed   By: Obie Dredge M.D.   On: 01/24/2024 13:21   DG Chest 2 View Result Date: 01/23/2024 CLINICAL DATA:  Central chest pain. EXAM: CHEST - 2 VIEW COMPARISON:  Mar 14, 2018 FINDINGS: The heart size and mediastinal contours are within normal limits. There is moderate severity calcification of the aortic arch. Mild left basilar atelectasis is seen. Additional very mild ill-defined areas of atelectasis are suspected within the upper left lung, mid right lung and right lung base. No pleural effusion or pneumothorax is identified. The visualized skeletal structures are unremarkable. IMPRESSION: 1. Mild bilateral atelectatic changes. Electronically Signed   By: Aram Candela M.D.   On: 01/23/2024 21:15     Pertinent labs & imaging results that were available during my care of the patient were reviewed by me and considered in my medical decision making (see MDM for details).  Medications Ordered in ED Medications  acetaminophen (TYLENOL) tablet 1,000 mg (1,000 mg Oral Given 01/24/24 1248)  ketorolac (TORADOL) 30 MG/ML injection 30 mg (30 mg Intramuscular Given 01/24/24 1246)                                                                                                                                     Procedures Procedures  (including critical care time)  Medical Decision Making / ED Course   This patient presents to the ED for concern of chest pain, this involves an extensive number of treatment options, and is a complaint that carries with it a high risk of complications and morbidity.  The differential diagnosis includes costochondritis, chest wall pain, ACS, less likely dissection, less likely  MDM: Troponin testing ordered, shoulder x-ray ordered.  Reproducible chest wall pain over the right side.  Reviewed the patient's troponin from yesterday, was 3.  Will get a good 24-hour delta here today.  Will also obtain repeat EKG on the patient.  Given that the patient's chest pain is reproducible, and her troponin being negative yesterday, my suspicion is that this is likely musculoskeletal pain.  Reassessment 1:30 PM-patient's troponin less than 2.  EKG does not show any evidence of ischemia.  Her shoulder x-ray is negative.  Believe this is musculoskeletal pain.  Will discharge with PCP follow-up.   Additional history obtained: -Additional history obtained from  -External records from outside source obtained and reviewed including: Chart review including previous notes, labs, imaging, consultation notes   Lab Tests: -I ordered, reviewed, and interpreted labs.   The pertinent results include:   Labs Reviewed  TROPONIN I (HIGH SENSITIVITY)      EKG my independent review of  the patient's EKG shows no ST segment depressions or elevations, no T wave inversions, no evidence of acute ischemia.  EKG Interpretation Date/Time:  Tuesday January 24 2024 10:55:41 EDT Ventricular Rate:  66 PR Interval:  222 QRS Duration:  94 QT Interval:  410 QTC Calculation: 430 R Axis:   -14  Text Interpretation: Sinus rhythm Prolonged PR interval Confirmed by Anders Simmonds [phone]) on 01/24/2024 12:08:16 PM         Imaging Studies ordered: I ordered imaging studies including shoulder x-ray I  independently visualized and interpreted imaging. I agree with the radiologist interpretation   Medicines ordered and prescription drug management: Meds ordered this encounter  Medications   DISCONTD: ketorolac (TORADOL) 30 MG/ML injection 30 mg   acetaminophen (TYLENOL) tablet 1,000 mg   ketorolac (TORADOL) 30 MG/ML injection 30 mg    -I have reviewed the patients home medicines and have made adjustments as needed  Cardiac Monitoring: The patient was maintained on a cardiac monitor.  I personally viewed and interpreted the cardiac monitored which showed an underlying rhythm of: Normal sinus rhythm  Social Determinants of Health:  Factors impacting patients care include: Lack of access to primary care   Reevaluation: After the interventions noted above, I reevaluated the patient and found that they have :improved  Co morbidities that complicate the patient evaluation  Past Medical History:  Diagnosis Date   Arthritis    knees and hands   DM (diabetes mellitus), type 2 (HCC) 12/18/2014   Essential hypertension 07/25/2018   OA (osteoarthritis) of knee 2022   bilateral   Prediabetes 12/18/2014   Sleep apnea, obstructive 2023   Vitamin D deficiency 2016      Dispostion: I considered admission for this patient, however with her reassuring workup believe outpatient management is appropriate.     Final Clinical Impression(s) / ED Diagnoses Final diagnoses:  Chest  wall pain     @PCDICTATION @    Anders Simmonds T, DO 01/24/24 1329

## 2024-01-24 NOTE — Telephone Encounter (Signed)
 Patient calls to ask to be seen today due to chest pain that started yesterday.   Cma looked at patient chart; patient did go to ER yesterday but left without dr. Tommas Olp due to long wait.   Report to patient she does need to be seen at ER due to the imaging she did with ER will be same image pcp will do.   Encourage patient to go back to ER.   Patient reports she will go to urgent care.

## 2024-01-24 NOTE — ED Notes (Signed)
 Phlebotomy to grab troponin.

## 2024-02-06 IMAGING — CT CT HEAD W/O CM
4 series · 16 of 47 positions shown, 18 images · non-contrast
Comparison: None Available.

CLINICAL DATA: Sudden onset of dizziness while at work. Headache,
new or worsening.



[Series 3: head bone · axial · 0.44mm/px · z∈[-114,-82]mm · 3 of 78 slices shown]
[im 8/78  bone]
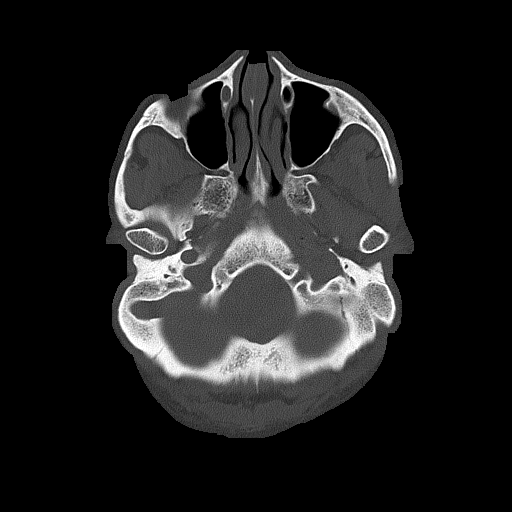
[im 16/78  bone]
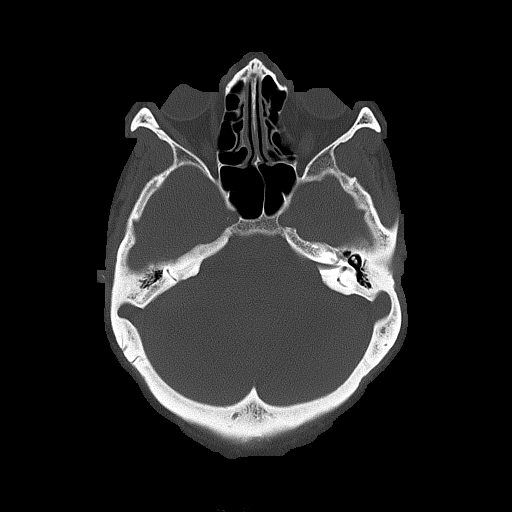
[im 24/78  bone]
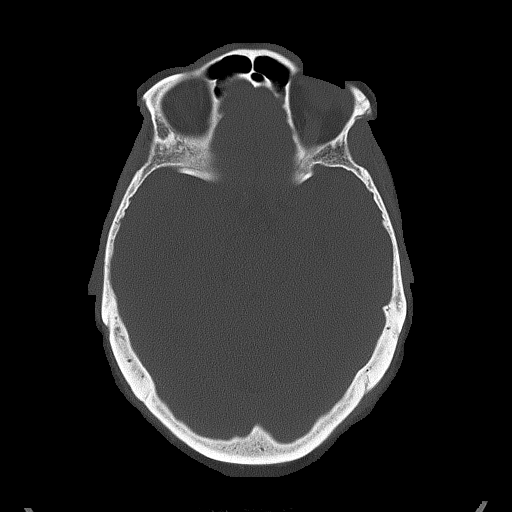

[Series 4: head without · axial · non-contrast · 0.44mm/px · z∈[-114,+6]mm · 7 of 32 slices shown, 9 images]
[im 4/32  brain]
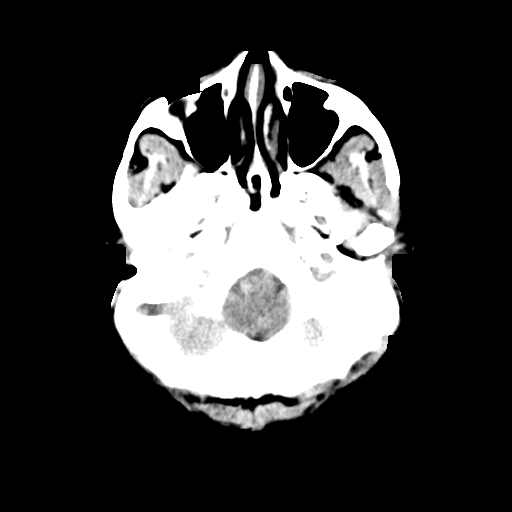
[im 4/32  bone]
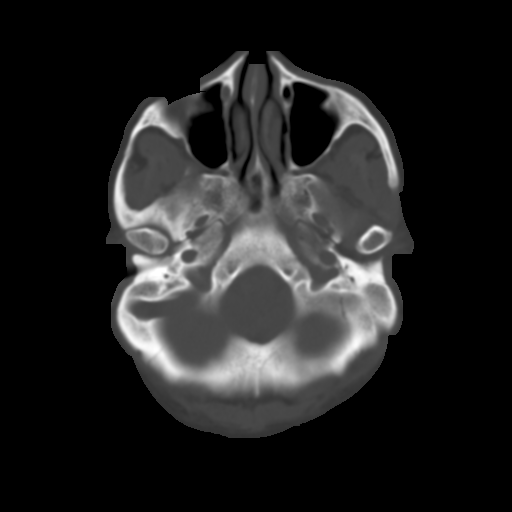
[im 8/32  brain]
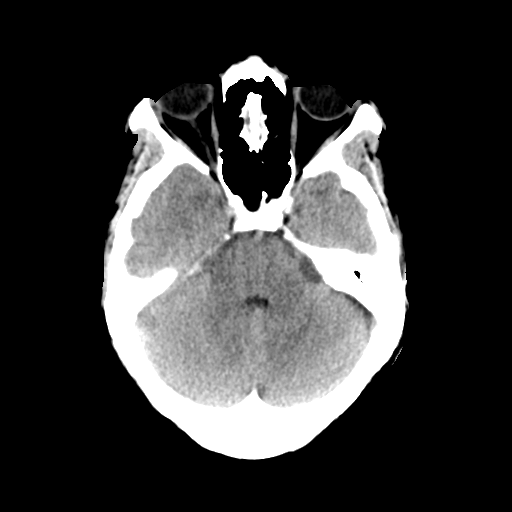
[im 12/32  brain]
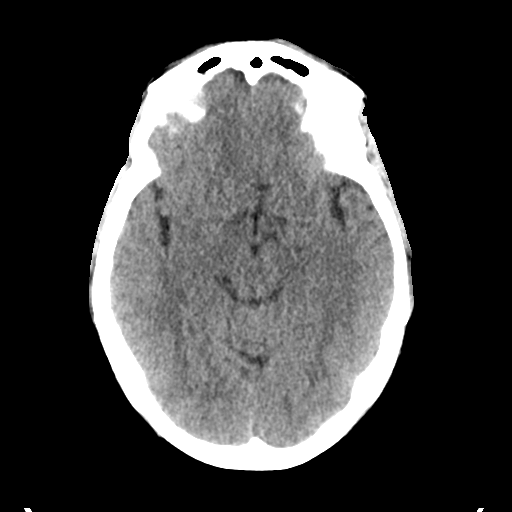
[im 16/32  brain]
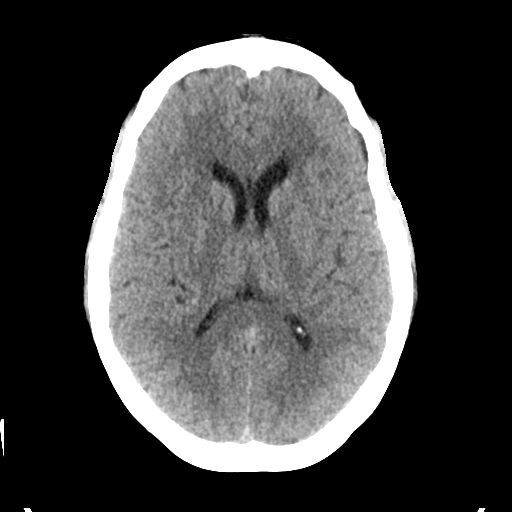
[im 20/32  brain]
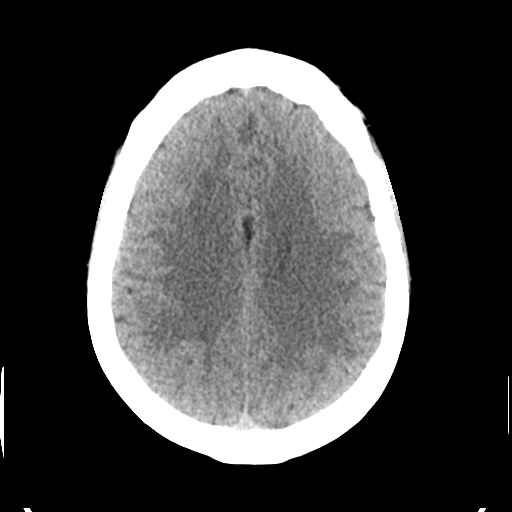
[im 20/32  bone]
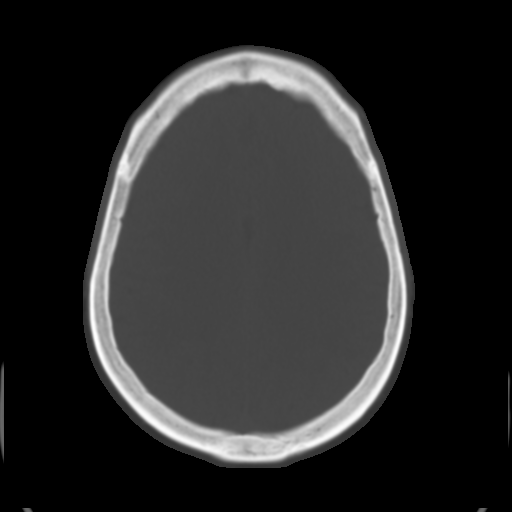
[im 24/32  brain]
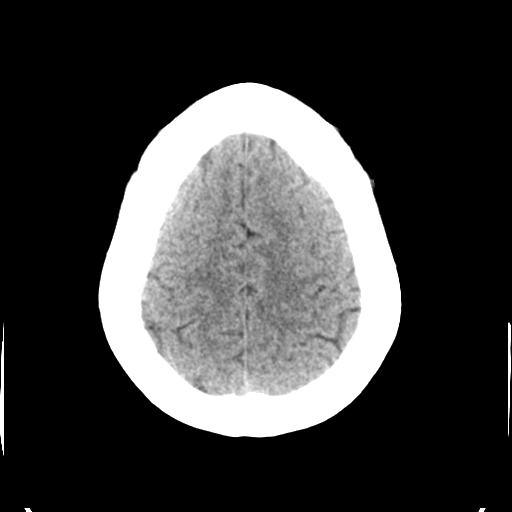
[im 28/32  brain]
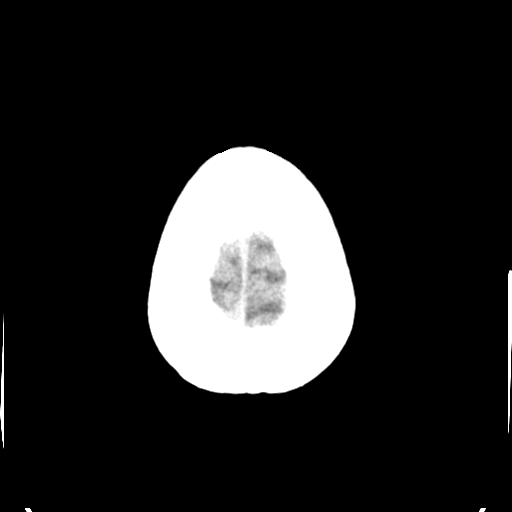

[Series 5: head without cor · coronal · non-contrast · 0.34mm/px · 3 of 69 slices shown]
[im 23/69  brain]
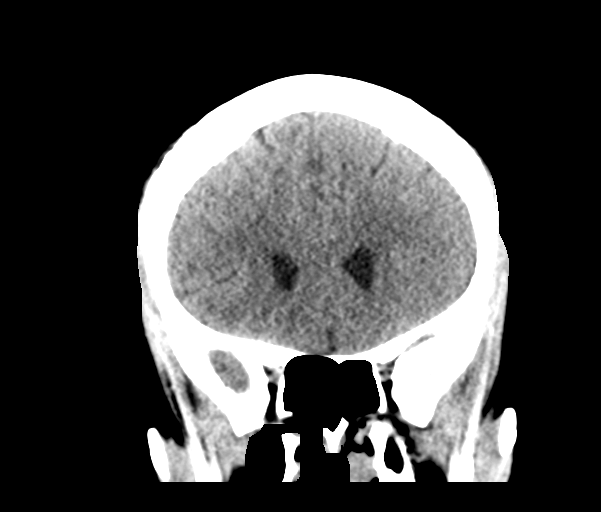
[im 31/69  brain]
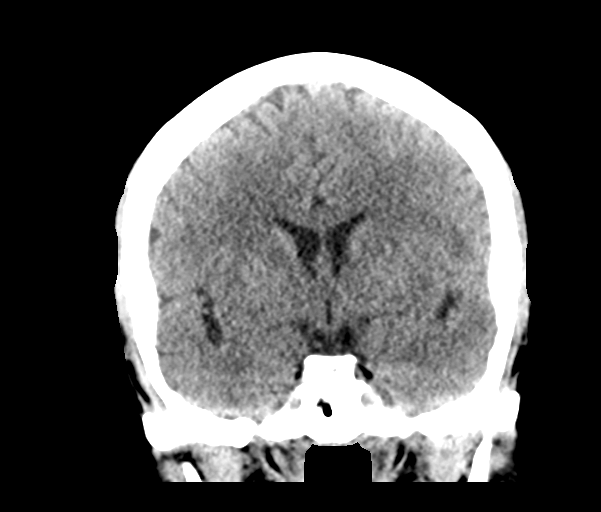
[im 38/69  brain]
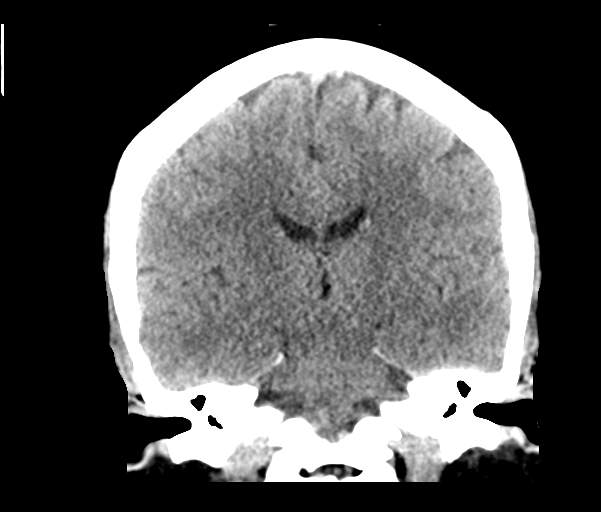

[Series 6: head without sag · sagittal · non-contrast · 0.34mm/px · 3 of 53 slices shown]
[im 18/53  brain]
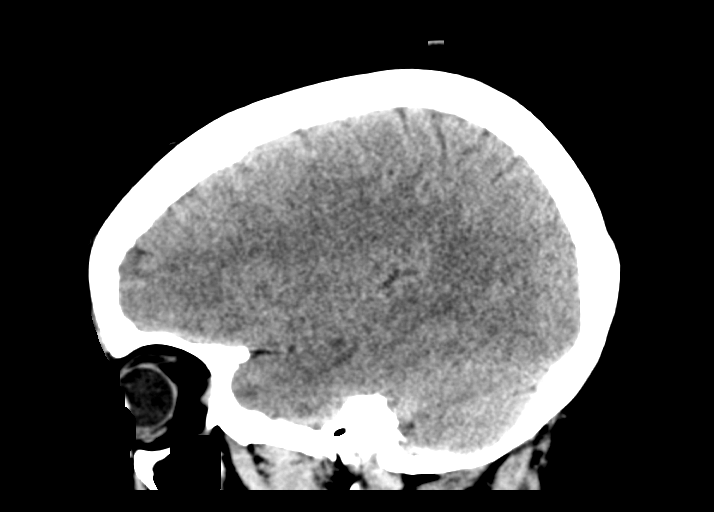
[im 27/53  brain]
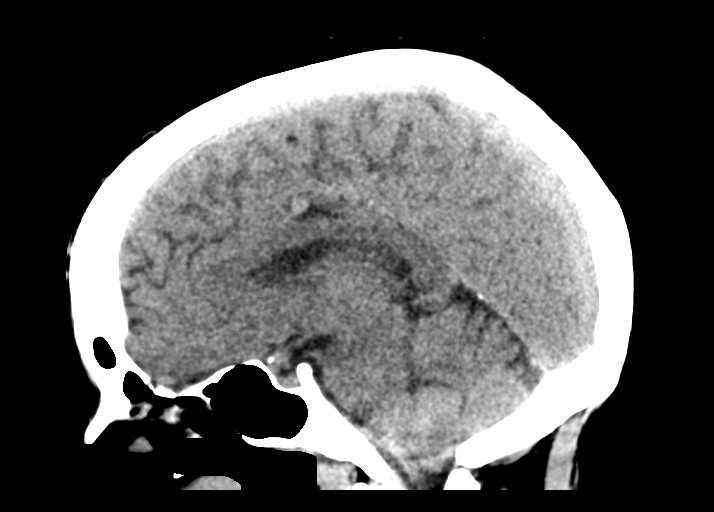
[im 35/53  brain]
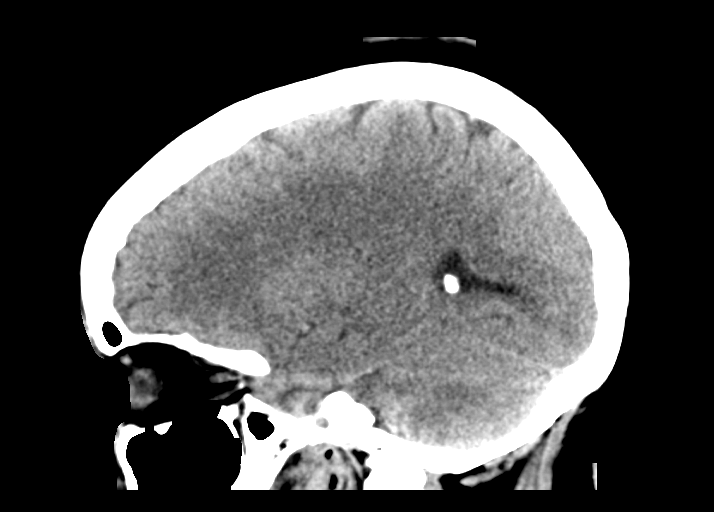

[16 of 47 positions shown; findings below may reference images not displayed]

FINDINGS: Brain: There is no evidence of acute intracranial hemorrhage, mass
lesion, brain edema or extra-axial fluid collection. The ventricles
and subarachnoid spaces are appropriately sized for age. There is no
CT evidence of acute cortical infarction.

Vascular: Intracranial atherosclerosis. No hyperdense vessel
identified.

Skull: Negative for fracture or focal lesion.

Sinuses/Orbits: The visualized paranasal sinuses and mastoid air
cells are clear. No orbital abnormalities are seen.

Other: Deformity of the lamina papyracea on the left which appears
chronic. No acute osseous findings.
IMPRESSION: No acute intracranial findings. Chronic deformity of the left lamina
papyracea.

## 2024-02-07 ENCOUNTER — Other Ambulatory Visit: Payer: Self-pay

## 2024-02-07 MED ORDER — HYDROCHLOROTHIAZIDE 25 MG PO TABS: 25.0000 mg | ORAL_TABLET | Freq: Every day | ORAL | 3 refills | Status: AC

## 2024-02-07 MED ORDER — LISINOPRIL 10 MG PO TABS: 10.0000 mg | ORAL_TABLET | Freq: Every day | ORAL | 3 refills | Status: AC

## 2024-02-20 ENCOUNTER — Telehealth: Payer: Self-pay

## 2024-02-20 NOTE — Telephone Encounter (Signed)
 Patient called today and stated that she will need a new parking placard as the one she has will be expiring this month, patient stated she had the placard for 6 months, patient would like to know if she can get it for a couple of years instead.

## 2024-03-06 ENCOUNTER — Encounter: Payer: Self-pay | Admitting: Internal Medicine

## 2024-03-06 ENCOUNTER — Ambulatory Visit (INDEPENDENT_AMBULATORY_CARE_PROVIDER_SITE_OTHER): Payer: Medicaid Other | Admitting: Internal Medicine

## 2024-03-06 VITALS — BP 122/62 | HR 67 | Resp 16 | Ht 63.75 in | Wt 250.0 lb

## 2024-03-06 DIAGNOSIS — Z6841 Body Mass Index (BMI) 40.0 and over, adult: Secondary | ICD-10-CM

## 2024-03-06 DIAGNOSIS — E119 Type 2 diabetes mellitus without complications: Secondary | ICD-10-CM | POA: Diagnosis not present

## 2024-03-06 DIAGNOSIS — E66813 Obesity, class 3: Secondary | ICD-10-CM

## 2024-03-06 DIAGNOSIS — M170 Bilateral primary osteoarthritis of knee: Secondary | ICD-10-CM | POA: Diagnosis not present

## 2024-03-06 DIAGNOSIS — Z23 Encounter for immunization: Secondary | ICD-10-CM

## 2024-03-06 DIAGNOSIS — Z111 Encounter for screening for respiratory tuberculosis: Secondary | ICD-10-CM

## 2024-03-06 DIAGNOSIS — I10 Essential (primary) hypertension: Secondary | ICD-10-CM | POA: Diagnosis not present

## 2024-03-06 MED ORDER — BLOOD GLUCOSE TEST VI STRP: ORAL_STRIP | 11 refills | Status: AC

## 2024-03-06 MED ORDER — LANCET DEVICE MISC: 0 refills | Status: AC

## 2024-03-06 MED ORDER — BLOOD GLUCOSE MONITORING SUPPL DEVI: 0 refills | Status: AC

## 2024-03-06 MED ORDER — LANCETS MISC. MISC
11 refills | Status: AC
Start: 2024-03-06 — End: ?

## 2024-03-06 MED ORDER — OZEMPIC (0.25 OR 0.5 MG/DOSE) 2 MG/3ML ~~LOC~~ SOPN: PEN_INJECTOR | SUBCUTANEOUS | 11 refills | Status: DC

## 2024-03-06 MED ORDER — EMPAGLIFLOZIN 10 MG PO TABS: 10.0000 mg | ORAL_TABLET | Freq: Every day | ORAL | 11 refills | Status: AC

## 2024-03-06 NOTE — Progress Notes (Signed)
    Subjective:    Patient ID: Sarah Singh, female   DOB: [birth year], 58 y.o.   MRN: [id number]   HPI  DM/Morbid obesity:  She has not really thought about switching to Ozempic/Mounjaro with Jardiance.    2.  OSA:  difficulty tolerating CPAP at times.  Has a choking feeling at times and stops using.  She has not called the respiratory support who set her up.  She has the number at home.    3.  Hypertension:  no problems with meds.  4.  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    5.  Right knee pain:  needs info for Dr. Arvella Bird office as would like to proceed with knee injections as more symptoms.  She is not using recumbent bike or pool walking.    Current Meds  Medication Sig   Calcium -Magnesium -Vitamin D  300-20-200 MG-MG-UNIT CHEW 2 chews by mouth twice daily.   Cholecalciferol (VITAMIN D3) 25 MCG (1000 UT) capsule Take 1 capsule (1,000 Units total) by mouth daily.   hydrochlorothiazide  (HYDRODIURIL ) 25 MG tablet Take 1 tablet (25 mg total) by mouth daily.   ibuprofen  (ADVIL ) 800 MG tablet Take 1 tablet (800 mg total) by mouth 3 (three) times daily.   lisinopril  (ZESTRIL ) 10 MG tablet Take 1 tablet (10 mg total) by mouth daily.   metFORMIN  (GLUCOPHAGE -XR) 500 MG 24 hr tablet 1 tab by mouth twice daily with a meal   rosuvastatin  (CRESTOR ) 20 MG tablet Take 1 tablet (20 mg total) by mouth daily.   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  Radial pulses normal and equal   Assessment & Plan   DM and morbid obesity:  Will start Ozempic and Jardiance with plans to come off Metformin  once started.    Follow up in 2 weeks with twice daily sugars Bring glucometer and Ozempic for instruction on use if necessary.  2.  Hypertension:  controlled  3.  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  OSA:  She will call respiratory supply company and discuss a different mask.  To call if not successful.    5.  OA of Knees:  R greater than left:  disability parking permit for 6 months only [facility] and [facility] with [facility] activity.  Dr Arvella Bird contact info given to schedule visit.

## 2024-03-06 NOTE — Telephone Encounter (Signed)
PATIENT HAS BEEN SEEN 

## 2024-03-09 ENCOUNTER — Encounter: Payer: Self-pay | Admitting: Internal Medicine

## 2024-03-09 LAB — QUANTIFERON-TB GOLD PLUS
QuantiFERON Mitogen Value: >10 [IU]/mL
QuantiFERON Nil Value: 0.05 [IU]/mL
QuantiFERON TB1 Ag Value: 0.05 [IU]/mL
QuantiFERON TB2 Ag Value: 0.03 [IU]/mL
QuantiFERON-TB Gold Plus: NEGATIVE

## 2024-03-21 ENCOUNTER — Other Ambulatory Visit

## 2024-03-21 NOTE — Progress Notes (Unsigned)
 Patient reports starting jardiance  10mg  and ozempic  0.25mg  every Tuesday on 03/11/2024 Reports is taking metformin  also

## 2024-04-03 ENCOUNTER — Telehealth: Payer: Self-pay

## 2024-04-03 NOTE — Telephone Encounter (Signed)
 Patient called today and would like for nurse to call her back with an answer.  Patient states she checked her sugar fasting and also with out taking her medication and the sugar level was 88. Patient would like to know if that is normal.

## 2024-04-03 NOTE — Telephone Encounter (Signed)
 Spoke with patient confirming sugar level was 88 this morning.   Reports feeling fine; just wanted to make sure level was not too low

## 2024-04-03 NOTE — Telephone Encounter (Signed)
 Appt for sugar level and weight check 04/04/2024

## 2024-04-04 ENCOUNTER — Other Ambulatory Visit

## 2024-04-04 ENCOUNTER — Other Ambulatory Visit: Payer: Self-pay

## 2024-04-04 MED ORDER — OZEMPIC (0.25 OR 0.5 MG/DOSE) 2 MG/3ML ~~LOC~~ SOPN: 0.5000 mg | PEN_INJECTOR | SUBCUTANEOUS | Status: DC

## 2024-04-04 NOTE — Progress Notes (Unsigned)
 Patient is here for sugar levels and weight check Reports taking ozempic  0.25 starting on 03/11/2024 and also taking metformin  and jardiance   Todays weight is 245lbs   Last weight on 03/21/2024 was 247lbs  Reports no problems or concerns    Dr. Jayne Mews 0.5mg  of ozempic  and stop metformin 

## 2024-04-18 ENCOUNTER — Other Ambulatory Visit

## 2024-04-18 NOTE — Progress Notes (Unsigned)
 Patient is here for weight check and sugar log.   Reports taking Jardiance  and ozempic  0.5mg  every Tuesday; started June 10    Last weight 247lbs  Todays weight 239.5lb   Reports no problems   Per Dr. Jayne Mews no change in medications Follow up in two weeks

## 2024-05-02 ENCOUNTER — Other Ambulatory Visit

## 2024-05-16 ENCOUNTER — Other Ambulatory Visit: Admitting: Internal Medicine

## 2024-05-18 ENCOUNTER — Other Ambulatory Visit

## 2024-05-18 MED ORDER — SEMAGLUTIDE (1 MG/DOSE) 4 MG/3ML ~~LOC~~ SOPN
1.0000 mg | PEN_INJECTOR | SUBCUTANEOUS | 11 refills | Status: DC
Start: 2024-05-18 — End: 2024-09-19

## 2024-05-18 NOTE — Progress Notes (Signed)
 Patient is taking ozempic  0.5mg  weekly. Patient has no questions or concerns.   Patient notified to increase ozempic  to 1mg  weekly. Patient will start medication 05/22/24

## 2024-05-18 NOTE — Progress Notes (Signed)
 Sugars generally low 100s, but spiking to 160--she cannot say what spikes her sugars.  Weight unchanged Increase Semaglutide  to 1 mg weekly See when she will give first dose and document. Follow up weight in 2 weeks with blood glucose

## 2024-06-07 ENCOUNTER — Other Ambulatory Visit

## 2024-06-08 ENCOUNTER — Other Ambulatory Visit (INDEPENDENT_AMBULATORY_CARE_PROVIDER_SITE_OTHER)

## 2024-06-08 DIAGNOSIS — E119 Type 2 diabetes mellitus without complications: Secondary | ICD-10-CM

## 2024-06-08 NOTE — Progress Notes (Signed)
 Patient has not yet started ozempic  1mg . Will call the pharmacy to find out the problem and will call patient once it is resolved.   Appeal for ozempic  coverage has been sent. Will wait to receive approval. Once we have approval, will ask patient to return in 2 weeks for weight and sugar check.

## 2024-06-13 LAB — HEMOGLOBIN A1C
Est. average glucose Bld gHb Est-mCnc: 137 mg/dL
Hgb A1c MFr Bld: 6.4 % — ABNORMAL HIGH (ref 4.8–5.6)

## 2024-06-21 ENCOUNTER — Telehealth: Payer: Self-pay | Admitting: Internal Medicine

## 2024-06-21 NOTE — Telephone Encounter (Signed)
 Patient called to scheduled her two week weight check and sugar log that was not scheduled last  time she came on 06/08/24.  Patient has been scheduled for 06/27/24.  Patient states she would like to know her lab results from her recent labs, patient would like to know if she can be notified of results same day she comes for weight check.

## 2024-06-27 ENCOUNTER — Other Ambulatory Visit

## 2024-06-27 ENCOUNTER — Ambulatory Visit: Payer: Self-pay | Admitting: Internal Medicine

## 2024-06-27 NOTE — Progress Notes (Unsigned)
 Pt. Reports that she has not started taking 1mg  ozempic  as of yet. Stated she wasn't aware to increase dose. States she will increase dose on next injection on Tuesday .Dose is 0.5.Currently taking Jardiance  10mg  daily. No issues with current medication.Current weight is 242lbs previous weight 240lbs.

## 2024-06-27 NOTE — Progress Notes (Deleted)
 Pt is here today weight check. Pt. Previous weight is current weight is .Dose amount 0.5 injection day Tuesday Started date unknown. No issues with the medication.

## 2024-07-11 ENCOUNTER — Other Ambulatory Visit (INDEPENDENT_AMBULATORY_CARE_PROVIDER_SITE_OTHER)

## 2024-07-11 DIAGNOSIS — E119 Type 2 diabetes mellitus without complications: Secondary | ICD-10-CM

## 2024-07-11 NOTE — Progress Notes (Unsigned)
 Pt. Reports that she started taking 1mg  ozempic  06/27/24. Injection date Tuesday.Currently taking Jardiance  10mg  daily. No issues with current medication.Current weight is 242lbs previous weight 240lbs.

## 2024-07-12 NOTE — Progress Notes (Unsigned)
 Sugars similar in morning and evening in low 100s.  Highest 121 in evening.   Not interested in further weight loss Not necessary for frequent weigh ins.

## 2024-07-17 ENCOUNTER — Encounter: Payer: Self-pay | Admitting: Internal Medicine

## 2024-07-17 ENCOUNTER — Ambulatory Visit (INDEPENDENT_AMBULATORY_CARE_PROVIDER_SITE_OTHER): Admitting: Internal Medicine

## 2024-07-17 VITALS — BP 127/80 | HR 72 | Resp 18 | Ht 64.0 in | Wt 243.0 lb

## 2024-07-17 DIAGNOSIS — E119 Type 2 diabetes mellitus without complications: Secondary | ICD-10-CM | POA: Diagnosis not present

## 2024-07-17 DIAGNOSIS — M7662 Achilles tendinitis, left leg: Secondary | ICD-10-CM | POA: Diagnosis not present

## 2024-07-17 DIAGNOSIS — E66813 Obesity, class 3: Secondary | ICD-10-CM

## 2024-07-17 DIAGNOSIS — Z6841 Body Mass Index (BMI) 40.0 and over, adult: Secondary | ICD-10-CM

## 2024-07-17 DIAGNOSIS — M170 Bilateral primary osteoarthritis of knee: Secondary | ICD-10-CM | POA: Diagnosis not present

## 2024-07-17 NOTE — Progress Notes (Signed)
 Subjective:    Patient ID: Sarah Singh, female   DOB: 1966-09-15, 58 y.o.   MRN: [id number]   HPI   Has been calling, demanding a disabled parking permit as her knees hurt and now her left posterior ankle area hurts as well.  Discussed at her visit in the spring we made a deal that she would sign up at a [facility] or other facility for water exercise and/or recumbent bicycle and then I would provide her with 6 months for parking permit.  She was also to call Dr. Damian office as she felt she was ready to get corticosteroid knee injections, but has not done so.  Was losing weight with Ozempic , but admits she is just sitting around when not working.  She has actually gained weight back on Ozempic  here recently.    2.  Left painful swelling, posterior left ankle.  Wears knee high boots that slide up and down and cause more pain.  She cannot recall other shoes that bother her.    Current Meds  Medication Sig   Blood Glucose Monitoring Suppl DEVI May substitute to any manufacturer covered by patient's insurance.  Check blood glucose twice daily before meal   Calcium -Magnesium -Vitamin D  300-20-200 MG-MG-UNIT CHEW 2 chews by mouth twice daily.   Cholecalciferol (VITAMIN D3) 25 MCG (1000 UT) capsule Take 1 capsule (1,000 Units total) by mouth daily.   empagliflozin  (JARDIANCE ) 10 MG TABS tablet Take 1 tablet (10 mg total) by mouth daily before breakfast.   Glucose Blood (BLOOD GLUCOSE TEST STRIPS) STRP May substitute to any manufacturer covered by patient's insurance.  Check blood glucose twice daily before meal   hydrochlorothiazide  (HYDRODIURIL ) 25 MG tablet Take 1 tablet (25 mg total) by mouth daily.   ibuprofen  (ADVIL ) 800 MG tablet Take 1 tablet (800 mg total) by mouth 3 (three) times daily.   Lancet Device MISC May substitute to any manufacturer covered by patient's insurance.  Check blood glucose twice daily before meals   Lancets Misc. MISC May substitute to any manufacturer covered by  patient's insurance.  Check blood glucose twice daily before meals   lisinopril  (ZESTRIL ) 10 MG tablet Take 1 tablet (10 mg total) by mouth daily.   rosuvastatin  (CRESTOR ) 20 MG tablet Take 1 tablet (20 mg total) by mouth daily.   Semaglutide , 1 MG/DOSE, 4 MG/3ML SOPN Inject 1 mg as directed once a week.   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  No overlying erythema no fluctuance.  May have a slight similar swelling at same level on right.  Assessment & Plan   Bilateral OA of knees in patient with morbid obesity:  Gave her [facility] information for low cost water exercise and recumbent bike.  She will bring back a receipt to show she has obtained and then make goals to go exercise several times weekly.  Will give her 6 months for parking thereafter.  2.  Left achilles tendinitis:  stop wearing boots that rub.  Will ask Dr. Vernetta to see her for  this as well   3.  OA of knees, R>>L with pain:  return to Dr Vernetta for injection to help with pain and mobility.

## 2024-07-17 NOTE — Patient Instructions (Signed)
[facility] [address] [place], [place] [address]  [phone] Hours of Operation Mondays to Thursdays: 8 am to 8 pm Fridays: 9 am to 8 pm Saturdays: 9 am to 1 pm Sundays: Closed  

## 2024-07-25 ENCOUNTER — Other Ambulatory Visit (INDEPENDENT_AMBULATORY_CARE_PROVIDER_SITE_OTHER)

## 2024-07-25 NOTE — Progress Notes (Signed)
 Pt. Reports that she started taking 1mg  ozempic  .Injection date Tuesday.Currently taking Jardiance  10mg  daily. No issues with current medication.Current weight is 236lbs previous weight 240lbs. Per Dr. Adella no changes in dose this week. Follow up in two weeks.

## 2024-08-08 ENCOUNTER — Ambulatory Visit (INDEPENDENT_AMBULATORY_CARE_PROVIDER_SITE_OTHER): Admitting: Orthopaedic Surgery

## 2024-08-08 ENCOUNTER — Other Ambulatory Visit

## 2024-08-08 VITALS — Wt 241.0 lb

## 2024-08-08 DIAGNOSIS — G8929 Other chronic pain: Secondary | ICD-10-CM

## 2024-08-08 DIAGNOSIS — M25562 Pain in left knee: Secondary | ICD-10-CM | POA: Diagnosis not present

## 2024-08-08 DIAGNOSIS — M25561 Pain in right knee: Secondary | ICD-10-CM

## 2024-08-08 MED ORDER — LIDOCAINE HCL 1 % IJ SOLN
3.0000 mL | INTRAMUSCULAR | Status: AC | PRN
  Administered 2024-08-08: 3 mL

## 2024-08-08 MED ORDER — METHYLPREDNISOLONE ACETATE 40 MG/ML IJ SUSP
40.0000 mg | INTRAMUSCULAR | Status: AC | PRN
  Administered 2024-08-08: 40 mg via INTRA_ARTICULAR

## 2024-08-08 NOTE — Progress Notes (Signed)
 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  She is a diabetic but has not hemoglobin A1c in the low 6 range.  She is morbidly obese with a BMI of 41.37.  She comes in today requesting steroid injections in both her knees.  She also want me to look at her left ankle and she points to a swollen area around her Achilles tendon on the left side.  She denies any specific injury.  On exam both knees have slight malalignment which appears to be varus but when she stands maybe a little valgus secondary to her obesity.  Both knees move well with no effusion but are painful.  Previous x-rays of both knees showed mild to moderate arthritis.  Examination of her Achilles tendon on the left side does show some swelling around the Achilles tendon but it is intact with a negative Sebastian time but is very painful.  I did give her a Thera-Band to show her how to do stretching to help the Achilles tendon.  Also want her to try Voltaren  gel on this area 2-3 times a day.  Weight loss will help her as well.  Per her request I did place a steroid injection in both knees today which she tolerated well.  She will continue to watch her blood glucose closely with the knowledge that steroid injections can increase her blood glucose.  She knows to wait at least 4 months between steroid injections.    Procedure Note  Patient: Sarah Singh             Date of Birth: 05-Feb-1966           MRN: [id number]             Visit Date: 08/08/2024  Procedures: Visit Diagnoses:  1. Chronic pain of right knee   2. Chronic pain of left knee     Large Joint Inj: R knee on 08/08/2024 2:12 PM Indications: diagnostic evaluation and pain Details: 22 G 1.5 in needle, superolateral approach  Arthrogram: No  Medications: 3 mL lidocaine  1 %; 40 mg methylPREDNISolone  acetate 40 MG/ML Outcome: tolerated well, no immediate complications Procedure, treatment alternatives,  risks and benefits explained, specific risks discussed. Consent was given by the patient. Immediately prior to procedure a time out was called to verify the correct patient, procedure, equipment, support staff and site/side marked as required. Patient was prepped and draped in the usual sterile fashion.    Large Joint Inj: L knee on 08/08/2024 2:12 PM Indications: diagnostic evaluation and pain Details: 22 G 1.5 in needle, superolateral approach  Arthrogram: No  Medications: 3 mL lidocaine  1 %; 40 mg methylPREDNISolone  acetate 40 MG/ML Outcome: tolerated well, no immediate complications Procedure, treatment alternatives, risks and benefits explained, specific risks discussed. Consent was given by the patient. Immediately prior to procedure a time out was called to verify the correct patient, procedure, equipment, support staff and site/side marked as required. Patient was prepped and draped in the usual sterile fashion.

## 2024-09-03 ENCOUNTER — Encounter: Admitting: Internal Medicine

## 2024-09-03 ENCOUNTER — Encounter: Payer: Self-pay | Admitting: Radiology

## 2024-09-04 ENCOUNTER — Encounter: Payer: Medicaid Other | Admitting: Internal Medicine

## 2024-09-17 ENCOUNTER — Telehealth: Payer: Self-pay | Admitting: Internal Medicine

## 2024-09-17 NOTE — Telephone Encounter (Signed)
 Patient left a voicemail stating she would like to talk to Dr. Adella as soon as possible.   Called patient back and patient did not answer.  Left a voicemail to patient asking to return call

## 2024-09-19 ENCOUNTER — Encounter: Payer: Self-pay | Admitting: Internal Medicine

## 2024-09-19 ENCOUNTER — Ambulatory Visit: Admitting: Internal Medicine

## 2024-09-19 VITALS — BP 124/70 | HR 70 | Resp 18 | Ht 62.0 in | Wt 249.0 lb

## 2024-09-19 DIAGNOSIS — Z0000 Encounter for general adult medical examination without abnormal findings: Secondary | ICD-10-CM | POA: Diagnosis not present

## 2024-09-19 DIAGNOSIS — E119 Type 2 diabetes mellitus without complications: Secondary | ICD-10-CM

## 2024-09-19 DIAGNOSIS — E7800 Pure hypercholesterolemia, unspecified: Secondary | ICD-10-CM | POA: Diagnosis not present

## 2024-09-19 DIAGNOSIS — I10 Essential (primary) hypertension: Secondary | ICD-10-CM | POA: Diagnosis not present

## 2024-09-19 DIAGNOSIS — Z1231 Encounter for screening mammogram for malignant neoplasm of breast: Secondary | ICD-10-CM

## 2024-09-19 DIAGNOSIS — Z124 Encounter for screening for malignant neoplasm of cervix: Secondary | ICD-10-CM | POA: Diagnosis not present

## 2024-09-19 DIAGNOSIS — L84 Corns and callosities: Secondary | ICD-10-CM | POA: Diagnosis not present

## 2024-09-19 DIAGNOSIS — Z113 Encounter for screening for infections with a predominantly sexual mode of transmission: Secondary | ICD-10-CM | POA: Diagnosis not present

## 2024-09-19 DIAGNOSIS — Z6841 Body Mass Index (BMI) 40.0 and over, adult: Secondary | ICD-10-CM

## 2024-09-19 DIAGNOSIS — E66813 Obesity, class 3: Secondary | ICD-10-CM | POA: Diagnosis not present

## 2024-09-19 LAB — POCT WET PREP WITH KOH
KOH Prep POC: NEGATIVE
RBC Wet Prep HPF POC: NEGATIVE
Trichomonas, UA: NEGATIVE
Yeast Wet Prep HPF POC: NEGATIVE

## 2024-09-19 MED ORDER — OZEMPIC (2 MG/DOSE) 8 MG/3ML ~~LOC~~ SOPN: PEN_INJECTOR | SUBCUTANEOUS | 11 refills | Status: AC

## 2024-09-19 MED ORDER — CALCIUM CITRATE 250 MG PO TABS: ORAL_TABLET | ORAL | Status: AC

## 2024-09-19 MED ORDER — VITAMIN D3 25 MCG (1000 UT) PO CAPS: 1000.0000 [IU] | ORAL_CAPSULE | Freq: Every day | ORAL | 3 refills | Status: AC

## 2024-09-19 MED ORDER — IBUPROFEN 200 MG PO TABS: ORAL_TABLET | ORAL | 2 refills | Status: AC

## 2024-09-19 NOTE — Progress Notes (Signed)
 "   Subjective:    Patient ID: Sarah Singh, female   DOB: [birth year], 58 y.o.   MRN: [id number]   HPI  CPE with pap  1.  Pap:  Last in 2022 and normal.    2.  Mammogram:  Last 12.23.2024 and normal.  No family history of breast cancer.    3.  Osteoprevention:  Not taking Calcium  regularly.  Taking D3 1000 units daily.  Last vitamin D  level 33.7 in June of this year.  States she is walking regularly with job.  Walks with her patient a lot as well.    4.  Guaiac Cards/FIT:  Never returned.  5.  Colonoscopy:  Last with Dr. Eda in 2023 with 2 hyperplastic rectal polyps.  Next due in 2033.  6.  Immunizations:  Needs Hep B #3, influenza and COVID, we are out of the latter 2 Immunization History  Administered Date(s) Administered   Hep A / Hep B 06/22/2023   Hepatitis A, Adult 03/06/2024   Hepatitis B, ADULT 03/06/2024   Influenza Inj Mdck Quad Pf 10/27/2018   Influenza, Mdck, Trivalent,PF 6+ MOS(egg free) 08/31/2023   Influenza-Unspecified 07/02/2021, 07/02/2022   Moderna Covid-19 Fall Seasonal Vaccine 28yrs & older 08/31/2023   PFIZER(Purple Top)SARS-COV-2 Vaccination 01/15/2020, 02/05/2020   PNEUMOCOCCAL CONJUGATE-20 02/24/2023   Pfizer Covid-19 Vaccine Bivalent Booster 55yrs & up 08/19/2021   Tdap 07/24/2018   Zoster Recombinant(Shingrix) 07/13/2021, 07/31/2021, 10/22/2021    7.  Glucose/Cholesterol:  A1C down to 6.4% in August.  Cholesterol panel excellent 1 year ago.  Lipid Panel     Component Value Date/Time   CHOL 123 09/20/2023 0900   TRIG 82 09/20/2023 0900   HDL 50 09/20/2023 0900   LDLCALC 57 09/20/2023 0900   LABVLDL 16 09/20/2023 0900     Current Meds  Medication Sig   Blood Glucose Monitoring Suppl DEVI May substitute to any manufacturer covered by patient's insurance.  Check blood glucose twice daily before meal   Calcium  Citrate 250 MG TABS 2 tabs by mouth twice daily   Cholecalciferol (VITAMIN D3) 25 MCG (1000 UT) capsule Take 1 capsule (1,000  Units total) by mouth daily.   empagliflozin  (JARDIANCE ) 10 MG TABS tablet Take 1 tablet (10 mg total) by mouth daily before breakfast.   Glucose Blood (BLOOD GLUCOSE TEST STRIPS) STRP May substitute to any manufacturer covered by patient's insurance.  Check blood glucose twice daily before meal   hydrochlorothiazide  (HYDRODIURIL ) 25 MG tablet Take 1 tablet (25 mg total) by mouth daily.   ibuprofen  (ADVIL ) 200 MG tablet 2 to 4 tabs by mouth with food every 6 hours as needed for pain   Lancet Device MISC May substitute to any manufacturer covered by patient's insurance.  Check blood glucose twice daily before meals   Lancets Misc. MISC May substitute to any manufacturer covered by patient's insurance.  Check blood glucose twice daily before meals   lisinopril  (ZESTRIL ) 10 MG tablet Take 1 tablet (10 mg total) by mouth daily.   rosuvastatin  (CRESTOR ) 20 MG tablet Take 1 tablet (20 mg total) by mouth daily.   Semaglutide , 1 MG/DOSE, 4 MG/3ML SOPN Inject 1 mg as directed once a week.   [DISCONTINUED] ibuprofen  (ADVIL ) 800 MG tablet Take 1 tablet (800 mg total) by mouth 3 (three) times daily.   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   Past Medical History:  Diagnosis Date   Arthritis    knees  and hands   DM (diabetes mellitus), type 2 (HCC) 12/18/2014   Essential hypertension 07/25/2018   OA (osteoarthritis) of knee 2022   bilateral   Prediabetes 12/18/2014   Sleep apnea, obstructive 2023   Vitamin D  deficiency 2016   Past Surgical History:  Procedure Laterality Date    TM tubes      TUBAL LIGATION  1992   Family History  Problem Relation Age of Onset   Arthritis Mother    Hypertension Mother    Diabetes Sister    Cancer Brother 55       throat cancer    Colon cancer Neg Hx    Colon polyps Neg Hx    Rectal cancer Neg Hx    Stomach cancer Neg Hx    Social History   Socioeconomic History   Marital status: Single     Spouse name: Not on file   Number of children: 2   Years of education: 11   Highest education level: Not on file  Occupational History   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  Stopped in 2010   Drug use: Not Currently    Types: Cocaine    Comment: no use since 2010.    Sexual activity: Not Currently    Birth control/protection: Surgical    Comment: History of recurrent STIs with previous female partners.  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   Social Drivers of Corporate Investment Banker Strain: Low Risk  (09/19/2024)   Overall Financial Resource Strain (CARDIA)    Difficulty of Paying Living Expenses: Not very hard  Food Insecurity: No Food Insecurity (09/19/2024)   Hunger Vital Sign    Worried About Running Out of Food in the Last Year: Never true    Ran Out of Food in the Last Year: Never true  Transportation Needs: No Transportation Needs (09/19/2024)   PRAPARE - Administrator, Civil Service (Medical): No    Lack of Transportation (Non-Medical): No  Physical Activity: Not on file  Stress: Not on file  Social Connections: Unknown (04/28/2023)   [facility]   Social Network    Social Network: Not on file  Intimate Partner Violence: Not At Risk (09/19/2024)   Humiliation, Afraid, Rape, and Kick questionnaire    Fear of Current or Ex-Partner: No    Emotionally Abused: No    Physically Abused: No     Sexually Abused: No     Review of Systems  Eyes:  Negative for visual disturbance (Cannot recall if went to Dr. Octavia in past year.).  Respiratory:  Positive for shortness of breath (She talks about CPAP mainly--feels the mask is diffcult to wear oftentimes.).   Cardiovascular:  Negative for chest pain.       Objective:   BP 124/70 (BP  Location: Right Arm, Patient Position: Sitting, Cuff Size: Normal)   Pulse 70   Resp 18   Ht 5' 2 (1.575 m)   Wt 249 lb (112.9 kg)   LMP  (LMP Unknown)   BMI 45.54 kg/m   Physical Exam Constitutional:      Appearance: She is obese.  HENT:     Head: Normocephalic and atraumatic.     Right Ear: Tympanic membrane, ear canal and external ear normal.     Left Ear: Tympanic membrane, ear canal and external ear normal.     Nose: Nose normal.     Mouth/Throat:     Mouth: Mucous membranes are moist.     Pharynx: Oropharynx is clear.  Eyes:     Extraocular Movements: Extraocular movements intact.     Conjunctiva/sclera: Conjunctivae normal.     Pupils: Pupils are equal, round, and reactive to light.     Comments: Discs sharp  Neck:     Thyroid: No thyroid mass or thyromegaly.  Cardiovascular:     Rate and Rhythm: Normal rate and regular rhythm.     Pulses:          Dorsalis pedis pulses are 2+ on the right side and 2+ on the left side.       Posterior tibial pulses are 2+ on the left side.     Heart sounds: S1 normal and S2 normal. No murmur heard.    No friction rub. No S3 or S4 sounds.     Comments: No carotid bruits.  Carotid, radial, femoral, DP and PT pulses normal and equal.   Pulmonary:     Effort: Pulmonary effort is normal.     Breath sounds: Normal breath sounds and air entry.  Chest:  Breasts:    Right: No inverted nipple, mass or nipple discharge.     Left: No inverted nipple, mass or nipple discharge.  Abdominal:     General: Bowel sounds are normal.     Palpations: Abdomen is soft. There is no hepatomegaly,  splenomegaly or mass.     Tenderness: There is no abdominal tenderness.     Hernia: No hernia is present.  Genitourinary:    Comments: Normal external female genitalia No cervical or uterine mucosal lesions No uterine or adnexal mass or tenderness. Musculoskeletal:        General: Normal range of motion.     Cervical back: Normal range of motion and neck supple.     Right lower leg: No edema.     Left lower leg: No edema.  Feet:     Right foot:     Protective Sensation: 10 sites tested.  10 sites sensed.     Skin integrity: Callus (medial great toe) present.     Left foot:     Protective Sensation: 10 sites tested.  10 sites sensed.     Skin integrity: Callus (Medial great toe) present.  Lymphadenopathy:     Head:     Right side of head: No submental or submandibular adenopathy.     Left side of head: No submental or submandibular adenopathy.     Cervical: No cervical adenopathy.     Upper Body:     Right upper body: No supraclavicular or axillary adenopathy.     Left upper body: No supraclavicular or axillary adenopathy.     Lower Body: No right inguinal adenopathy. No left inguinal adenopathy.  Skin:    General: Skin is warm.     Capillary  Refill: Capillary refill takes less than 2 seconds.     Findings: No rash.  Neurological:     General: No focal deficit present.     Mental Status: She is alert and oriented to person, place, and time.     Cranial Nerves: Cranial nerves 2-12 are intact.     Sensory: Sensation is intact.     Motor: Motor function is intact.     Coordination: Coordination is intact.     Gait: Gait is intact.     Deep Tendon Reflexes: Reflexes are normal and symmetric.  Psychiatric:        Mood and Affect: Mood normal.        Speech: Speech normal.        Behavior: Behavior normal. Behavior is cooperative.      Assessment & Plan   CPE with pap GC/chlamydia  Mammogram next month FIT to return in 2 weeks. Will need to return for Hep B  #3 Encouraged her to obtain influenza and COVID vaccines at pharmacy of choice as we are out currently.  2.  DM:  Increase Ozempic  to 2 mg weekly for improved glucose control and weight loss. Needs to work on lifestyle changes as remains morbidly obese Eye referral.    3.  Great toe calluses:  referral to podiatry for improved footwear and treatment of calluses.    4.  Hypertension:  controlled.  "

## 2024-09-19 NOTE — Telephone Encounter (Signed)
 Patient has been scheduled for an appointment.

## 2024-09-20 ENCOUNTER — Other Ambulatory Visit: Payer: Self-pay | Admitting: Internal Medicine

## 2024-09-23 ENCOUNTER — Ambulatory Visit: Payer: Self-pay | Admitting: Internal Medicine

## 2024-09-23 LAB — GC/CHLAMYDIA PROBE AMP
Chlamydia trachomatis, NAA: NEGATIVE
Neisseria Gonorrhoeae by PCR: NEGATIVE

## 2024-10-01 ENCOUNTER — Ambulatory Visit: Admitting: Podiatry

## 2024-10-04 ENCOUNTER — Encounter: Payer: Self-pay | Admitting: Podiatry

## 2024-10-04 ENCOUNTER — Ambulatory Visit: Admitting: Podiatry

## 2024-10-04 VITALS — Ht 62.0 in | Wt 249.0 lb

## 2024-10-04 DIAGNOSIS — M216X1 Other acquired deformities of right foot: Secondary | ICD-10-CM

## 2024-10-04 DIAGNOSIS — M216X2 Other acquired deformities of left foot: Secondary | ICD-10-CM

## 2024-10-04 NOTE — Progress Notes (Signed)
 Subjective:   Patient ID: Sarah Singh, female   DOB: 58 y.o.   MRN: [id number]   HPI Patient presents with generalized foot structural issues with diabetes and corn callus formation that also can become painful for her.  States she tries to be active   ROS      Objective:  Physical Exam  Neurovascular status was found to be intact from a circulatory perspective with moderate diminishment sharp dull vibratory.  Patient has depression of the arch bilateral with acquired foot structural issues and has created increased pressure points leading to keratotic tissue formation     Assessment:  Chronic lesion formation with pain bilateral and foot structural issues     Plan:  H&P reviewed aggressive debridement accomplished and I discussed her generalized foot structural issues do not recommend current treatment except for supportive shoes and wide accommodation signed visit

## 2024-10-05 ENCOUNTER — Ambulatory Visit: Admitting: Internal Medicine

## 2024-10-10 LAB — CYTOLOGY - PAP

## 2024-10-12 ENCOUNTER — Other Ambulatory Visit

## 2024-10-26 ENCOUNTER — Ambulatory Visit
Admission: RE | Admit: 2024-10-26 | Discharge: 2024-10-26 | Disposition: A | Discharge status: Home / Self Care | Source: Ambulatory Visit | Attending: Internal Medicine | Admitting: Internal Medicine

## 2024-10-26 ENCOUNTER — Ambulatory Visit

## 2024-10-26 DIAGNOSIS — Z1231 Encounter for screening mammogram for malignant neoplasm of breast: Secondary | ICD-10-CM

## 2024-11-06 ENCOUNTER — Other Ambulatory Visit (INDEPENDENT_AMBULATORY_CARE_PROVIDER_SITE_OTHER)

## 2024-11-06 VITALS — Wt 248.0 lb

## 2024-11-06 DIAGNOSIS — Z23 Encounter for immunization: Secondary | ICD-10-CM

## 2024-11-06 DIAGNOSIS — I10 Essential (primary) hypertension: Secondary | ICD-10-CM | POA: Diagnosis not present

## 2024-11-06 DIAGNOSIS — Z79899 Other long term (current) drug therapy: Secondary | ICD-10-CM | POA: Diagnosis not present

## 2024-11-06 DIAGNOSIS — E7800 Pure hypercholesterolemia, unspecified: Secondary | ICD-10-CM | POA: Diagnosis not present

## 2024-11-06 DIAGNOSIS — E119 Type 2 diabetes mellitus without complications: Secondary | ICD-10-CM | POA: Diagnosis not present

## 2024-11-06 NOTE — Progress Notes (Unsigned)
 Weight today 248 lb   last time 249 lb  Sugar   AM 89-122  PM - 98-121 The patient gets Jardiance  and Ozempic  2mg  for a couple of months.   No change

## 2024-11-07 LAB — CBC WITH DIFFERENTIAL/PLATELET
Basophils Absolute: 0.1 x10E3/uL (ref 0.0–0.2)
Basos: 1 %
EOS (ABSOLUTE): 0.2 x10E3/uL (ref 0.0–0.4)
Eos: 2 %
Hematocrit: 41.3 % (ref 34.0–46.6)
Hemoglobin: 13.4 g/dL (ref 11.1–15.9)
Immature Grans (Abs): 0 x10E3/uL (ref 0.0–0.1)
Immature Granulocytes: 0 %
Lymphocytes Absolute: 3.5 x10E3/uL — ABNORMAL HIGH (ref 0.7–3.1)
Lymphs: 42 %
MCH: 29.6 pg (ref 26.6–33.0)
MCHC: 32.4 g/dL (ref 31.5–35.7)
MCV: 91 fL (ref 79–97)
Monocytes Absolute: 0.6 x10E3/uL (ref 0.1–0.9)
Monocytes: 7 %
Neutrophils Absolute: 4 x10E3/uL (ref 1.4–7.0)
Neutrophils: 48 %
Platelets: 304 x10E3/uL (ref 150–450)
RBC: 4.52 x10E6/uL (ref 3.77–5.28)
RDW: 12.9 % (ref 11.7–15.4)
WBC: 8.3 x10E3/uL (ref 3.4–10.8)

## 2024-11-07 LAB — COMPREHENSIVE METABOLIC PANEL WITH GFR
ALT: 13 IU/L (ref 0–32)
AST: 17 IU/L (ref 0–40)
Albumin: 4.2 g/dL (ref 3.8–4.9)
Alkaline Phosphatase: 112 IU/L (ref 49–135)
BUN/Creatinine Ratio: 21 (ref 9–23)
BUN: 13 mg/dL (ref 6–24)
Bilirubin Total: 0.5 mg/dL (ref 0.0–1.2)
CO2: 24 mmol/L (ref 20–29)
Calcium: 9.8 mg/dL (ref 8.7–10.2)
Chloride: 101 mmol/L (ref 96–106)
Creatinine, Ser: 0.62 mg/dL (ref 0.57–1.00)
Globulin, Total: 3.7 g/dL (ref 1.5–4.5)
Glucose: 102 mg/dL — ABNORMAL HIGH (ref 70–99)
Potassium: 3.8 mmol/L (ref 3.5–5.2)
Sodium: 141 mmol/L (ref 134–144)
Total Protein: 7.9 g/dL (ref 6.0–8.5)
eGFR: 104 mL/min/1.73

## 2024-11-07 LAB — LIPID PANEL W/O CHOL/HDL RATIO
Cholesterol, Total: 127 mg/dL (ref 100–199)
HDL: 55 mg/dL
LDL Chol Calc (NIH): 59 mg/dL (ref 0–99)
Triglycerides: 61 mg/dL (ref 0–149)
VLDL Cholesterol Cal: 13 mg/dL (ref 5–40)

## 2024-11-07 LAB — HEMOGLOBIN A1C
Est. average glucose Bld gHb Est-mCnc: 140 mg/dL
Hgb A1c MFr Bld: 6.5 % — ABNORMAL HIGH (ref 4.8–5.6)

## 2024-11-07 LAB — MICROALBUMIN / CREATININE URINE RATIO
Creatinine, Urine: 67.9 mg/dL
Microalb/Creat Ratio: 5 mg/g{creat} (ref 0–29)
Microalbumin, Urine: 3.7 ug/mL

## 2024-11-19 ENCOUNTER — Ambulatory Visit: Payer: Self-pay | Admitting: Internal Medicine

## 2025-01-18 ENCOUNTER — Other Ambulatory Visit

## 2025-01-22 ENCOUNTER — Ambulatory Visit: Admitting: Internal Medicine

## 2025-03-21 ENCOUNTER — Encounter: Admitting: Internal Medicine

## 2025-09-20 ENCOUNTER — Other Ambulatory Visit: Admitting: Internal Medicine

## 2025-09-24 ENCOUNTER — Encounter: Admitting: Internal Medicine
